# Patient Record
Sex: Male | Born: 1975 | Race: White | Hispanic: No | Marital: Single
Health system: Midwestern US, Community
[De-identification: ages and names within clinical notes are randomized; demographics above are authoritative.]

## PROBLEM LIST (undated history)

## (undated) DIAGNOSIS — E7800 Pure hypercholesterolemia, unspecified: Secondary | ICD-10-CM

## (undated) DIAGNOSIS — E039 Hypothyroidism, unspecified: Secondary | ICD-10-CM

## (undated) DIAGNOSIS — Z1159 Encounter for screening for other viral diseases: Secondary | ICD-10-CM

## (undated) DIAGNOSIS — I10 Essential (primary) hypertension: Secondary | ICD-10-CM

## (undated) DIAGNOSIS — M62830 Muscle spasm of back: Secondary | ICD-10-CM

## (undated) DIAGNOSIS — N201 Calculus of ureter: Secondary | ICD-10-CM

## (undated) DIAGNOSIS — J302 Other seasonal allergic rhinitis: Secondary | ICD-10-CM

## (undated) DIAGNOSIS — E781 Pure hyperglyceridemia: Secondary | ICD-10-CM

## (undated) DIAGNOSIS — F419 Anxiety disorder, unspecified: Secondary | ICD-10-CM

## (undated) DIAGNOSIS — Z87442 Personal history of urinary calculi: Secondary | ICD-10-CM

## (undated) HISTORY — DX: Anxiety disorder, unspecified: F41.9

## (undated) HISTORY — DX: Pure hyperglyceridemia: E78.1

---

## 2010-07-08 DIAGNOSIS — E039 Hypothyroidism, unspecified: Secondary | ICD-10-CM | POA: Insufficient documentation

## 2010-07-08 DIAGNOSIS — E559 Vitamin D deficiency, unspecified: Secondary | ICD-10-CM | POA: Insufficient documentation

## 2010-07-08 DIAGNOSIS — F419 Anxiety disorder, unspecified: Secondary | ICD-10-CM | POA: Insufficient documentation

## 2014-10-02 ENCOUNTER — Encounter (HOSPITAL_BASED_OUTPATIENT_CLINIC_OR_DEPARTMENT_OTHER): Payer: Self-pay | Admitting: *Deleted

## 2014-10-02 ENCOUNTER — Emergency Department (HOSPITAL_BASED_OUTPATIENT_CLINIC_OR_DEPARTMENT_OTHER)
Admission: EM | Admit: 2014-10-02 | Discharge: 2014-10-02 | Disposition: A | Discharge status: Home / Self Care | Payer: BLUE CROSS/BLUE SHIELD | Attending: Emergency Medicine | Admitting: Emergency Medicine

## 2014-10-02 ENCOUNTER — Emergency Department (HOSPITAL_BASED_OUTPATIENT_CLINIC_OR_DEPARTMENT_OTHER): Payer: BLUE CROSS/BLUE SHIELD

## 2014-10-02 DIAGNOSIS — R109 Unspecified abdominal pain: Secondary | ICD-10-CM | POA: Diagnosis present

## 2014-10-02 DIAGNOSIS — N200 Calculus of kidney: Secondary | ICD-10-CM | POA: Diagnosis not present

## 2014-10-02 DIAGNOSIS — Z720 Tobacco use: Secondary | ICD-10-CM | POA: Insufficient documentation

## 2014-10-02 DIAGNOSIS — Z79899 Other long term (current) drug therapy: Secondary | ICD-10-CM | POA: Diagnosis not present

## 2014-10-02 LAB — CBC
HCT: 45.3 % (ref 39.0–52.0)
Hemoglobin: 16 g/dL (ref 13.0–17.0)
MCH: 29.6 pg (ref 26.0–34.0)
MCHC: 35.3 g/dL (ref 30.0–36.0)
MCV: 83.9 fL (ref 78.0–100.0)
PLATELETS: 295 10*3/uL (ref 150–400)
RBC: 5.4 MIL/uL (ref 4.22–5.81)
RDW: 13.3 % (ref 11.5–15.5)
WBC: 8.3 10*3/uL (ref 4.0–10.5)

## 2014-10-02 LAB — URINALYSIS, ROUTINE W REFLEX MICROSCOPIC
BILIRUBIN URINE: NEGATIVE
Glucose, UA: NEGATIVE mg/dL
Ketones, ur: NEGATIVE mg/dL
Leukocytes, UA: NEGATIVE
Nitrite: NEGATIVE
Protein, ur: NEGATIVE mg/dL
SPECIFIC GRAVITY, URINE: 1.029 (ref 1.005–1.030)
UROBILINOGEN UA: 0.2 mg/dL (ref 0.0–1.0)
pH: 5.5 (ref 5.0–8.0)

## 2014-10-02 LAB — COMPREHENSIVE METABOLIC PANEL
ALT: 26 U/L (ref 17–63)
ANION GAP: 8 (ref 5–15)
AST: 28 U/L (ref 15–41)
Albumin: 4.4 g/dL (ref 3.5–5.0)
Alkaline Phosphatase: 53 U/L (ref 38–126)
BUN: 29 mg/dL — AB (ref 6–20)
CHLORIDE: 107 mmol/L (ref 101–111)
CO2: 23 mmol/L (ref 22–32)
Calcium: 9.4 mg/dL (ref 8.9–10.3)
Creatinine, Ser: 1.38 mg/dL — ABNORMAL HIGH (ref 0.61–1.24)
Glucose, Bld: 116 mg/dL — ABNORMAL HIGH (ref 65–99)
POTASSIUM: 4.2 mmol/L (ref 3.5–5.1)
Sodium: 138 mmol/L (ref 135–145)
Total Bilirubin: 0.7 mg/dL (ref 0.3–1.2)
Total Protein: 7.5 g/dL (ref 6.5–8.1)

## 2014-10-02 LAB — URINE MICROSCOPIC-ADD ON

## 2014-10-02 MED ORDER — ONDANSETRON HCL 4 MG/2ML IJ SOLN
4.0000 mg | Freq: Once | INTRAMUSCULAR | Status: AC
  Administered 2014-10-02: 4 mg via INTRAVENOUS

## 2014-10-02 MED ORDER — FENTANYL CITRATE (PF) 100 MCG/2ML IJ SOLN
INTRAMUSCULAR | Status: DC
Start: 2014-10-02 — End: 2014-10-02
  Filled 2014-10-02: qty 2

## 2014-10-02 MED ORDER — OXYCODONE-ACETAMINOPHEN 5-325 MG PO TABS: 1.0000 | ORAL_TABLET | Freq: Four times a day (QID) | ORAL | Status: DC | PRN

## 2014-10-02 MED ORDER — KETOROLAC TROMETHAMINE 30 MG/ML IJ SOLN
30.0000 mg | Freq: Once | INTRAMUSCULAR | Status: AC
  Administered 2014-10-02: 30 mg via INTRAVENOUS

## 2014-10-02 MED ORDER — FENTANYL CITRATE (PF) 100 MCG/2ML IJ SOLN
100.0000 ug | Freq: Once | INTRAMUSCULAR | Status: AC
  Administered 2014-10-02: 100 ug via INTRAVENOUS

## 2014-10-02 MED ORDER — KETOROLAC TROMETHAMINE 30 MG/ML IJ SOLN
INTRAMUSCULAR | Status: AC
  Filled 2014-10-02: qty 1

## 2014-10-02 MED ORDER — ONDANSETRON HCL 4 MG/2ML IJ SOLN
INTRAMUSCULAR | Status: AC
  Filled 2014-10-02: qty 2

## 2014-10-02 MED ORDER — TAMSULOSIN HCL 0.4 MG PO CAPS: 0.4000 mg | ORAL_CAPSULE | Freq: Every day | ORAL | Status: DC

## 2014-10-02 NOTE — ED Notes (Signed)
MD at bedside. 

## 2014-10-02 NOTE — ED Provider Notes (Signed)
CSN: [id number]     Arrival date & time 10/02/14  1256 History   First MD Initiated Contact with Patient 10/02/14 1322     Chief Complaint  Patient presents with  . Flank Pain     (Consider location/radiation/quality/duration/timing/severity/associated sxs/prior Treatment) HPI Comments: Patient is a 39 year old male with no significant past medical history. He presents for evaluation of severe right flank pain that started this morning slightly after waking from sleep. He denies any injury or trauma. He denies any fever, chills, or bowel or bladder complaints.  Patient is a 39 y.o. male presenting with flank pain. The history is provided by the patient.  Flank Pain This is a new problem. The current episode started 3 to 5 hours ago. The problem occurs constantly. The problem has been rapidly worsening. Associated symptoms include abdominal pain. Nothing aggravates the symptoms. Nothing relieves the symptoms. He has tried rest for the symptoms. The treatment provided no relief.    History reviewed. No pertinent past medical history. History reviewed. No pertinent past surgical history. History reviewed. No pertinent family history. Social History  Substance Use Topics  . Smoking status: Current Every Day Smoker -- 1.00 packs/day    Types: Cigarettes  . Smokeless tobacco: None  . Alcohol Use: No    Review of Systems  Gastrointestinal: Positive for abdominal pain.  Genitourinary: Positive for flank pain.  All other systems reviewed and are negative.     Allergies  Review of patient's allergies indicates no known allergies.  Home Medications   Prior to Admission medications   Medication Sig Start Date End Date Taking? Authorizing Provider  levothyroxine (SYNTHROID, LEVOTHROID) 100 MCG tablet Take 100 mcg by mouth daily before breakfast.   Yes Historical Provider, MD  LORazepam (ATIVAN) 0.5 MG tablet Take 0.5 mg by mouth every 8 (eight) hours.   Yes Historical Provider, MD    BP 114/92 mmHg  Pulse 82  Temp(Src) 98.9 F (37.2 C) (Oral)  Resp 18  Ht 5' 5"  (1.651 m)  Wt 185 lb (83.915 kg)  BMI 30.79 kg/m2  SpO2 98% Physical Exam  Constitutional: He is oriented to person, place, and time. He appears well-developed and well-nourished. No distress.  HENT:  Head: Normocephalic and atraumatic.  Neck: Normal range of motion. Neck supple.  Cardiovascular: Normal rate, regular rhythm and normal heart sounds.   No murmur heard. Pulmonary/Chest: Effort normal and breath sounds normal. No respiratory distress. He has no wheezes.  Abdominal: Soft. Bowel sounds are normal. He exhibits no distension. There is tenderness. There is no rebound and no guarding.  There is slight right CVA tenderness and right lower quadrant tenderness.  Musculoskeletal: Normal range of motion. He exhibits no edema.  Neurological: He is alert and oriented to person, place, and time.  Skin: Skin is warm and dry. He is not diaphoretic.  Nursing note and vitals reviewed.   ED Course  Procedures (including critical care time) Labs Review Labs Reviewed  URINALYSIS, ROUTINE W REFLEX MICROSCOPIC (NOT AT [facility])  CBC  COMPREHENSIVE METABOLIC PANEL    Imaging Review No results found. I have personally reviewed and evaluated these images and lab results as part of my medical decision-making.   EKG Interpretation None      MDM   Final diagnoses:  Flank pain    CT scan reveals a 4 mm stone in the mid ureter with moderate hydronephrosis. He is feeling better with medications given in the ER. There is no fever, white count, and urinalysis  does not reflect infection. Will be discharged with pain medication, Flomax, and when necessary follow-up with urology if the stone is not passed in the next 2-3 days.    Veryl Speak, MD 10/02/14 1501

## 2014-10-02 NOTE — ED Notes (Signed)
Pt amb to [facility] w/o difficulty

## 2014-10-02 NOTE — ED Notes (Signed)
Family at bedside. 

## 2014-10-02 NOTE — Discharge Instructions (Signed)
Percocet as prescribed as needed for pain.  Flomax as prescribed.  Call [facility] to schedule a follow-up appointment if your stone has not passed in the next 2-3 days. Return to the ER if pain worsens, you develop high fever, or other new and concerning symptoms.   Kidney Stones Kidney stones (urolithiasis) are deposits that form inside your kidneys. The intense pain is caused by the stone moving through the urinary tract. When the stone moves, the ureter goes into spasm around the stone. The stone is usually passed in the urine.  CAUSES   A disorder that makes certain neck glands produce too much parathyroid hormone (primary hyperparathyroidism).  A buildup of uric acid crystals, similar to gout in your joints.  Narrowing (stricture) of the ureter.  A kidney obstruction present at birth (congenital obstruction).  Previous surgery on the kidney or ureters.  Numerous kidney infections. SYMPTOMS   Feeling sick to your stomach (nauseous).  Throwing up (vomiting).  Blood in the urine (hematuria).  Pain that usually spreads (radiates) to the groin.  Frequency or urgency of urination. DIAGNOSIS   Taking a history and physical exam.  Blood or urine tests.  CT scan.  Occasionally, an examination of the inside of the urinary bladder (cystoscopy) is performed. TREATMENT   Observation.  Increasing your fluid intake.  Extracorporeal shock wave lithotripsy--This is a noninvasive procedure that uses shock waves to break up kidney stones.  Surgery may be needed if you have severe pain or persistent obstruction. There are various surgical procedures. Most of the procedures are performed with the use of small instruments. Only small incisions are needed to accommodate these instruments, so recovery time is minimized. The size, location, and chemical composition are all important variables that will determine the proper choice of action for you. Talk to your health care  provider to better understand your situation so that you will minimize the risk of injury to yourself and your kidney.  HOME CARE INSTRUCTIONS   Drink enough water and fluids to keep your urine clear or pale yellow. This will help you to pass the stone or stone fragments.  Strain all urine through the provided strainer. Keep all particulate matter and stones for your health care provider to see. The stone causing the pain may be as small as a grain of salt. It is very important to use the strainer each and every time you pass your urine. The collection of your stone will allow your health care provider to analyze it and verify that a stone has actually passed. The stone analysis will often identify what you can do to reduce the incidence of recurrences.  Only take over-the-counter or prescription medicines for pain, discomfort, or fever as directed by your health care provider.  Make a follow-up appointment with your health care provider as directed.  Get follow-up X-rays if required. The absence of pain does not always mean that the stone has passed. It may have only stopped moving. If the urine remains completely obstructed, it can cause loss of kidney function or even complete destruction of the kidney. It is your responsibility to make sure X-rays and follow-ups are completed. Ultrasounds of the kidney can show blockages and the status of the kidney. Ultrasounds are not associated with any radiation and can be performed easily in a matter of minutes. SEEK MEDICAL CARE IF:  You experience pain that is progressive and unresponsive to any pain medicine you have been prescribed. SEEK IMMEDIATE MEDICAL CARE IF:  Pain cannot be controlled with the prescribed medicine.  You have a fever or shaking chills.  The severity or intensity of pain increases over 18 hours and is not relieved by pain medicine.  You develop a new onset of abdominal pain.  You feel faint or pass out.  You are unable to  urinate. MAKE SURE YOU:   Understand these instructions.  Will watch your condition.  Will get help right away if you are not doing well or get worse. Document Released: 01/13/2005 Document Revised: 09/15/2012 Document Reviewed: 06/16/2012 [facility] Patient Information 2015 [place], [place]. This information is not intended to replace advice given to you by your health care provider. Make sure you discuss any questions you have with your health care provider.

## 2014-10-02 NOTE — ED Notes (Signed)
Right flank pain, N/V that started this morning.  Pt in obvious pain.

## 2015-04-03 DIAGNOSIS — S39012A Strain of muscle, fascia and tendon of lower back, initial encounter: Secondary | ICD-10-CM | POA: Insufficient documentation

## 2015-04-24 DIAGNOSIS — K580 Irritable bowel syndrome with diarrhea: Secondary | ICD-10-CM | POA: Insufficient documentation

## 2015-05-16 DIAGNOSIS — G4700 Insomnia, unspecified: Secondary | ICD-10-CM | POA: Insufficient documentation

## 2015-05-17 ENCOUNTER — Encounter (HOSPITAL_COMMUNITY): Payer: Self-pay | Admitting: Emergency Medicine

## 2015-05-17 ENCOUNTER — Emergency Department (HOSPITAL_COMMUNITY)
Admission: EM | Admit: 2015-05-17 | Discharge: 2015-05-17 | Disposition: A | Discharge status: Home / Self Care | Payer: Managed Care, Other (non HMO) | Attending: Emergency Medicine | Admitting: Emergency Medicine

## 2015-05-17 DIAGNOSIS — K644 Residual hemorrhoidal skin tags: Secondary | ICD-10-CM | POA: Diagnosis not present

## 2015-05-17 DIAGNOSIS — F17210 Nicotine dependence, cigarettes, uncomplicated: Secondary | ICD-10-CM | POA: Diagnosis not present

## 2015-05-17 DIAGNOSIS — K6289 Other specified diseases of anus and rectum: Secondary | ICD-10-CM | POA: Diagnosis present

## 2015-05-17 DIAGNOSIS — Z87442 Personal history of urinary calculi: Secondary | ICD-10-CM | POA: Insufficient documentation

## 2015-05-17 MED ORDER — HYDROCODONE-ACETAMINOPHEN 5-325 MG PO TABS: 1.0000 | ORAL_TABLET | ORAL | Status: DC | PRN

## 2015-05-17 MED ORDER — LIDOCAINE HCL 2 % IJ SOLN
5.0000 mL | Freq: Once | INTRAMUSCULAR | Status: AC
  Administered 2015-05-17: 5 mL via INTRADERMAL
  Filled 2015-05-17: qty 20

## 2015-05-17 MED ORDER — HYDROCORTISONE 2.5 % RE CREA: TOPICAL_CREAM | RECTAL | Status: DC

## 2015-05-17 NOTE — ED Notes (Signed)
Pt c/o of swollen hemorrhoid and 7/10 rectal pain getting progressively worse. Pt denies any bleeding.

## 2015-05-17 NOTE — ED Provider Notes (Signed)
CSN: [id number]     Arrival date & time 05/17/15  0357 History   First MD Initiated Contact with Patient 05/17/15 0505     Chief Complaint  Patient presents with  . Rectal Pain     (Consider location/radiation/quality/duration/timing/severity/associated sxs/prior Treatment) HPI Comments: Patient with complaint of rectal pain secondary to large hemorrhoid that started to swell and become painful yesterday. No bleeding. History of hemorrhoids usually responsive to sitz baths and OTC medications. He denies constipation.  The history is provided by the patient. No language interpreter was used.    Past Medical History  Diagnosis Date  . Kidney stones    History reviewed. No pertinent past surgical history. History reviewed. No pertinent family history. Social History  Substance Use Topics  . Smoking status: Current Every Day Smoker -- 1.00 packs/day    Types: Cigarettes  . Smokeless tobacco: None  . Alcohol Use: No    Review of Systems  Constitutional: Negative for fever.  Gastrointestinal: Positive for rectal pain. Negative for abdominal pain and blood in stool.  Genitourinary: Negative for scrotal swelling and testicular pain.      Allergies  Review of patient's allergies indicates no known allergies.  Home Medications   Prior to Admission medications   Medication Sig Start Date End Date Taking? Authorizing Provider  levothyroxine (SYNTHROID, LEVOTHROID) 100 MCG tablet Take 100 mcg by mouth daily before breakfast.    Historical Provider, MD  LORazepam (ATIVAN) 0.5 MG tablet Take 0.5 mg by mouth every 8 (eight) hours.    Historical Provider, MD  oxyCODONE-acetaminophen (PERCOCET) 5-325 MG per tablet Take 1-2 tablets by mouth every 6 (six) hours as needed. 10/02/14   Veryl Speak, MD  tamsulosin (FLOMAX) 0.4 MG CAPS capsule Take 1 capsule (0.4 mg total) by mouth daily. 10/02/14   Veryl Speak, MD   BP 162/107 mmHg  Pulse 88  Temp(Src) 98 F (36.7 C) (Oral)  Resp 17  Ht  5' 5"  (1.651 m)  Wt 87.091 kg  BMI 31.95 kg/m2  SpO2 100% Physical Exam  Constitutional: He is oriented to person, place, and time. He appears well-developed and well-nourished.  Neck: Normal range of motion.  Pulmonary/Chest: Effort normal.  Abdominal: There is no tenderness.  Genitourinary:  Large external hemorrhoid without bleed.   Musculoskeletal: Normal range of motion.  Neurological: He is alert and oriented to person, place, and time.  Skin: Skin is warm and dry.  Psychiatric: He has a normal mood and affect.    ED Course  Procedures (including critical care time) Labs Review Labs Reviewed - No data to display  Imaging Review No results found. I have personally reviewed and evaluated these images and lab results as part of my medical decision-making.   EKG Interpretation None     INCISION AND DRAINAGE Performed by: Charlann Lange A Consent: Verbal consent obtained. Risks and benefits: risks, benefits and alternatives were discussed Type: external hemorrhoid  Body area: external hemorrhoid  Anesthesia: local infiltration  Incision was made with a #11 scalpel.  Local anesthetic: lidocaine 2% w/o epinephrine  Anesthetic total: 1 ml  Complexity: complex Blunt dissection to break up loculations  Drainage: purulent  Drainage amount: large clot evacuated    Patient tolerance: Patient tolerated the procedure well with no immediate complications.    MDM   Final diagnoses:  None    1. External hemorrhoid  Hemorrhoid lanced as per above note. Patient feels significantly improved. Stable for discharge home.     Charlann Lange, PA-C  27/63/94 3200  Delora Fuel, MD 37/94/44 [id number]

## 2015-05-17 NOTE — Discharge Instructions (Signed)
USE ANUSOL AS DIRECTED. USE NORCO WITH CAUTION AND WITH A STOOL SOFTENER TO AVOID CONSTIPATION. FOLLOW UP WITH DR. Kieth Brightly (GENERAL SURGEON) IF HEMORRHOID RECURS.    Hemorrhoids Hemorrhoids are swollen veins around the rectum or anus. There are two types of hemorrhoids:   Internal hemorrhoids. These occur in the veins just inside the rectum. They may poke through to the outside and become irritated and painful.  External hemorrhoids. These occur in the veins outside the anus and can be felt as a painful swelling or hard lump near the anus. CAUSES  Pregnancy.   Obesity.   Constipation or diarrhea.   Straining to have a bowel movement.   Sitting for long periods on the toilet.  Heavy lifting or other activity that caused you to strain.  Anal intercourse. SYMPTOMS   Pain.   Anal itching or irritation.   Rectal bleeding.   Fecal leakage.   Anal swelling.   One or more lumps around the anus.  DIAGNOSIS  Your caregiver may be able to diagnose hemorrhoids by visual examination. Other examinations or tests that may be performed include:   Examination of the rectal area with a gloved hand (digital rectal exam).   Examination of anal canal using a small tube (scope).   A blood test if you have lost a significant amount of blood.  A test to look inside the colon (sigmoidoscopy or colonoscopy). TREATMENT Most hemorrhoids can be treated at home. However, if symptoms do not seem to be getting better or if you have a lot of rectal bleeding, your caregiver may perform a procedure to help make the hemorrhoids get smaller or remove them completely. Possible treatments include:   Placing a rubber band at the base of the hemorrhoid to cut off the circulation (rubber band ligation).   Injecting a chemical to shrink the hemorrhoid (sclerotherapy).   Using a tool to burn the hemorrhoid (infrared light therapy).   Surgically removing the hemorrhoid (hemorrhoidectomy).    Stapling the hemorrhoid to block blood flow to the tissue (hemorrhoid stapling).  HOME CARE INSTRUCTIONS   Eat foods with fiber, such as whole grains, beans, nuts, fruits, and vegetables. Ask your doctor about taking products with added fiber in them (fibersupplements).  Increase fluid intake. Drink enough water and fluids to keep your urine clear or pale yellow.   Exercise regularly.   Go to the bathroom when you have the urge to have a bowel movement. Do not wait.   Avoid straining to have bowel movements.   Keep the anal area dry and clean. Use wet toilet paper or moist towelettes after a bowel movement.   Medicated creams and suppositories may be used or applied as directed.   Only take over-the-counter or prescription medicines as directed by your caregiver.   Take warm sitz baths for 15-20 minutes, 3-4 times a day to ease pain and discomfort.   Place ice packs on the hemorrhoids if they are tender and swollen. Using ice packs between sitz baths may be helpful.   Put ice in a plastic bag.   Place a towel between your skin and the bag.   Leave the ice on for 15-20 minutes, 3-4 times a day.   Do not use a donut-shaped pillow or sit on the toilet for long periods. This increases blood pooling and pain.  SEEK MEDICAL CARE IF:  You have increasing pain and swelling that is not controlled by treatment or medicine.  You have uncontrolled bleeding.  You have  difficulty or you are unable to have a bowel movement.  You have pain or inflammation outside the area of the hemorrhoids. MAKE SURE YOU:  Understand these instructions.  Will watch your condition.  Will get help right away if you are not doing well or get worse.   This information is not intended to replace advice given to you by your health care provider. Make sure you discuss any questions you have with your health care provider.   Document Released: 01/11/2000 Document Revised: 12/31/2011  Document Reviewed: 11/18/2011 Elsevier Interactive Patient Education [employer].

## 2015-09-13 ENCOUNTER — Emergency Department (HOSPITAL_COMMUNITY)
Admission: EM | Admit: 2015-09-13 | Discharge: 2015-09-13 | Disposition: A | Discharge status: Home / Self Care | Payer: Managed Care, Other (non HMO) | Attending: Emergency Medicine | Admitting: Emergency Medicine

## 2015-09-13 ENCOUNTER — Emergency Department (HOSPITAL_COMMUNITY): Payer: Managed Care, Other (non HMO)

## 2015-09-13 ENCOUNTER — Encounter (HOSPITAL_COMMUNITY): Payer: Self-pay | Admitting: Emergency Medicine

## 2015-09-13 DIAGNOSIS — N200 Calculus of kidney: Secondary | ICD-10-CM | POA: Insufficient documentation

## 2015-09-13 DIAGNOSIS — R1032 Left lower quadrant pain: Secondary | ICD-10-CM | POA: Diagnosis present

## 2015-09-13 DIAGNOSIS — F17210 Nicotine dependence, cigarettes, uncomplicated: Secondary | ICD-10-CM | POA: Insufficient documentation

## 2015-09-13 DIAGNOSIS — Z79899 Other long term (current) drug therapy: Secondary | ICD-10-CM | POA: Diagnosis not present

## 2015-09-13 LAB — URINALYSIS, ROUTINE W REFLEX MICROSCOPIC
Bilirubin Urine: NEGATIVE
Glucose, UA: NEGATIVE mg/dL
KETONES UR: NEGATIVE mg/dL
Leukocytes, UA: NEGATIVE
NITRITE: NEGATIVE
Protein, ur: NEGATIVE mg/dL
SPECIFIC GRAVITY, URINE: 1.026 (ref 1.005–1.030)
pH: 6.5 (ref 5.0–8.0)

## 2015-09-13 LAB — COMPREHENSIVE METABOLIC PANEL
ALT: 23 U/L (ref 17–63)
AST: 26 U/L (ref 15–41)
Albumin: 4.4 g/dL (ref 3.5–5.0)
Alkaline Phosphatase: 58 U/L (ref 38–126)
Anion gap: 11 (ref 5–15)
BUN: 23 mg/dL — ABNORMAL HIGH (ref 6–20)
CALCIUM: 9.6 mg/dL (ref 8.9–10.3)
CHLORIDE: 104 mmol/L (ref 101–111)
CO2: 22 mmol/L (ref 22–32)
Creatinine, Ser: 1.12 mg/dL (ref 0.61–1.24)
GFR calc Af Amer: >60 mL/min (ref >60)
GFR calc non Af Amer: >60 mL/min (ref >60)
Glucose, Bld: 102 mg/dL — ABNORMAL HIGH (ref 65–99)
POTASSIUM: 3.7 mmol/L (ref 3.5–5.1)
SODIUM: 137 mmol/L (ref 135–145)
TOTAL PROTEIN: 6.9 g/dL (ref 6.5–8.1)
Total Bilirubin: 0.5 mg/dL (ref 0.3–1.2)

## 2015-09-13 LAB — CBC
HCT: 45.9 % (ref 39.0–52.0)
HEMOGLOBIN: 15.9 g/dL (ref 13.0–17.0)
MCH: 29.9 pg (ref 26.0–34.0)
MCHC: 34.6 g/dL (ref 30.0–36.0)
MCV: 86.3 fL (ref 78.0–100.0)
Platelets: 311 10*3/uL (ref 150–400)
RBC: 5.32 MIL/uL (ref 4.22–5.81)
RDW: 13.4 % (ref 11.5–15.5)
WBC: 12.3 10*3/uL — ABNORMAL HIGH (ref 4.0–10.5)

## 2015-09-13 LAB — URINE MICROSCOPIC-ADD ON: WBC UA: NONE SEEN WBC/hpf (ref 0–5)

## 2015-09-13 LAB — LIPASE, BLOOD: LIPASE: 42 U/L (ref 11–51)

## 2015-09-13 MED ORDER — TAMSULOSIN HCL 0.4 MG PO CAPS: 0.4000 mg | ORAL_CAPSULE | Freq: Every day | ORAL | 0 refills | Status: DC

## 2015-09-13 MED ORDER — OXYCODONE-ACETAMINOPHEN 5-325 MG PO TABS: 1.0000 | ORAL_TABLET | Freq: Four times a day (QID) | ORAL | 0 refills | Status: DC | PRN

## 2015-09-13 MED ORDER — ONDANSETRON HCL 4 MG/2ML IJ SOLN
4.0000 mg | Freq: Once | INTRAMUSCULAR | Status: AC
  Administered 2015-09-13: 4 mg via INTRAVENOUS
  Filled 2015-09-13: qty 2

## 2015-09-13 MED ORDER — HYDROMORPHONE HCL 1 MG/ML IJ SOLN: 0.5000 mg | Freq: Once | INTRAMUSCULAR | Status: DC

## 2015-09-13 MED ORDER — DOCUSATE SODIUM 100 MG PO CAPS: 100.0000 mg | ORAL_CAPSULE | Freq: Two times a day (BID) | ORAL | 0 refills | Status: DC

## 2015-09-13 MED ORDER — ONDANSETRON 4 MG PO TBDP: 4.0000 mg | ORAL_TABLET | Freq: Three times a day (TID) | ORAL | 0 refills | Status: DC | PRN

## 2015-09-13 MED ORDER — OXYCODONE-ACETAMINOPHEN 5-325 MG PO TABS
1.0000 | ORAL_TABLET | Freq: Once | ORAL | Status: AC
  Administered 2015-09-13: 1 via ORAL

## 2015-09-13 MED ORDER — KETOROLAC TROMETHAMINE 30 MG/ML IJ SOLN
30.0000 mg | Freq: Once | INTRAMUSCULAR | Status: AC
Start: 2015-09-13 — End: 2015-09-13
  Administered 2015-09-13: 30 mg via INTRAVENOUS
  Filled 2015-09-13: qty 1

## 2015-09-13 MED ORDER — SODIUM CHLORIDE 0.9 % IV BOLUS (SEPSIS)
1000.0000 mL | Freq: Once | INTRAVENOUS | Status: AC
  Administered 2015-09-13: 1000 mL via INTRAVENOUS

## 2015-09-13 MED ORDER — OXYCODONE-ACETAMINOPHEN 5-325 MG PO TABS
ORAL_TABLET | ORAL | Status: AC
  Filled 2015-09-13: qty 1

## 2015-09-13 MED ORDER — HYDROMORPHONE HCL 1 MG/ML IJ SOLN
1.0000 mg | Freq: Once | INTRAMUSCULAR | Status: AC
  Administered 2015-09-13: 1 mg via INTRAVENOUS
  Filled 2015-09-13: qty 1

## 2015-09-13 MED ORDER — IBUPROFEN 800 MG PO TABS: 800.0000 mg | ORAL_TABLET | Freq: Three times a day (TID) | ORAL | 0 refills | Status: DC | PRN

## 2015-09-13 NOTE — ED Provider Notes (Signed)
TIME SEEN: 6:00 AM  CHIEF COMPLAINT: Left-sided flank pain  HPI: Pt is a 40 y.o. male with history of kidney stents who presents to the emergency department with left-sided flank pain. States pain started yesterday and ascending onset. No aggravating or relieving factors. Reports he feels he can get comfortable. Has had vomiting. States this feels exactly like his prior kidney stones but worse. Radiates into his groin. Denies fevers, chills, diarrhea, abdominal pain, dysuria, testicular pain or swelling, penile discharge. Has never had surgery for his kidney stones.  ROS: See HPI Constitutional: no fever  Eyes: no drainage  ENT: no runny nose   Cardiovascular:  no chest pain  Resp: no SOB  GI: no vomiting GU: no dysuria Integumentary: no rash  Allergy: no hives  Musculoskeletal: no leg swelling  Neurological: no slurred speech ROS otherwise negative  PAST MEDICAL HISTORY/PAST SURGICAL HISTORY:  Past Medical History:  Diagnosis Date  . Kidney stones     MEDICATIONS:  Prior to Admission medications   Medication Sig Start Date End Date Taking? Authorizing Provider  HYDROcodone-acetaminophen (NORCO/VICODIN) 5-325 MG tablet Take 1-2 tablets by mouth every 4 (four) hours as needed. 05/17/15   Charlann Lange, PA-C  hydrocortisone (ANUSOL-HC) 2.5 % rectal cream Apply rectally 2 times daily 05/17/15   Charlann Lange, PA-C  levothyroxine (SYNTHROID, LEVOTHROID) 112 MCG tablet Take 112 mcg by mouth daily. 02/28/15   Historical Provider, MD  oxyCODONE-acetaminophen (PERCOCET) 5-325 MG per tablet Take 1-2 tablets by mouth every 6 (six) hours as needed. Patient not taking: Reported on 05/17/2015 10/02/14   Veryl Speak, MD  Suvorexant (BELSOMRA) 5 MG TABS Take 5-10 mg by mouth at bedtime. 05/16/15   Historical Provider, MD  tamsulosin (FLOMAX) 0.4 MG CAPS capsule Take 1 capsule (0.4 mg total) by mouth daily. Patient not taking: Reported on 05/17/2015 10/02/14   Veryl Speak, MD    ALLERGIES:  No Known  Allergies  SOCIAL HISTORY:  Social History  Substance Use Topics  . Smoking status: Current Every Day Smoker    Packs/day: 1.00    Types: Cigarettes  . Smokeless tobacco: Not on file  . Alcohol use No    FAMILY HISTORY: No family history on file.  EXAM: BP 95/56 (BP Location: Right Arm)   Pulse 90   Resp (!) 30   Ht 5' 5"  (1.651 m)   Wt 195 lb (88.5 kg)   SpO2 100%   BMI 32.45 kg/m  CONSTITUTIONAL: Alert and oriented and responds appropriately to questions. He appears very uncomfortable, moaning and crying out in pain, writhing around in bed, grabbing at his left flank. Patient is afebrile, nontoxic appearing. HEAD: Normocephalic EYES: Conjunctivae clear, PERRL ENT: normal nose; no rhinorrhea; moist mucous membranes NECK: Supple, no meningismus, no LAD  CARD: RRR; S1 and S2 appreciated; no murmurs, no clicks, no rubs, no gallops RESP: Normal chest excursion without splinting or tachypnea; breath sounds clear and equal bilaterally; no wheezes, no rhonchi, no rales, no hypoxia or respiratory distress, speaking full sentences ABD/GI: Normal bowel sounds; non-distended; soft, non-tender, no rebound, no guarding, no peritoneal signs BACK:  The back appears normal and is non-tender to palpation over the midline spine without step-off or deformity, he does have left-sided CVA tenderness on exam EXT: Normal ROM in all joints; non-tender to palpation; no edema; normal capillary refill; no cyanosis, no calf tenderness or swelling    SKIN: Normal color for age and race; warm; no rash NEURO: Moves all extremities equally, sensation to light touch  intact diffusely, cranial nerves II through XII intact PSYCH: The patient's mood and manner are appropriate. Grooming and personal hygiene are appropriate.  MEDICAL DECISION MAKING: Patient here with left-sided flank pain similar to his prior kidney stones. Appears very uncomfortable on exam. He is not hypertensive. I doubt that this is a  dissection. I think this is more likely renal colic. Will obtain a CT of his abdomen and pelvis. Labs show mild leukocytosis. Urine shows blood but no other sign of infection. We'll give pain medicine, nausea medicine, IV fluids.  ED PROGRESS: Patient's pain is improved.  Urine shows no sign of infection. CT scan shows a 1 mm distal left ureteral stone. We'll give him a urine strainer, discharge with pain medication, Flomax, Zofran. Given outpatient urology follow-up. Patient and wife at bedside are comfortable with this plan.   At this time, I do not feel there is any life-threatening condition present. I have reviewed and discussed all results (EKG, imaging, lab, urine as appropriate), exam findings with patient/family. I have reviewed nursing notes and appropriate previous records.  I feel the patient is safe to be discharged home without further emergent workup and can continue workup as an outpatient. Discussed usual and customary return precautions. Patient/family verbalize understanding and are comfortable with this plan.  Outpatient follow-up has been provided. All questions have been answered.      [place], DO 09/18/15 [phone]

## 2015-09-13 NOTE — ED Triage Notes (Signed)
Pt. reports LLQ pain radiating to left groin onset this evening with emesis , pt. stated pain similar to his kidney stone in the past . Denies fever or chills.

## 2015-10-17 DIAGNOSIS — E781 Pure hyperglyceridemia: Secondary | ICD-10-CM | POA: Insufficient documentation

## 2015-11-22 ENCOUNTER — Ambulatory Visit (INDEPENDENT_AMBULATORY_CARE_PROVIDER_SITE_OTHER): Payer: Managed Care, Other (non HMO) | Admitting: Allergy

## 2015-11-22 ENCOUNTER — Encounter: Payer: Self-pay | Admitting: Allergy

## 2015-11-22 VITALS — BP 110/72 | HR 96 | Temp 98.2°F | Ht 65.0 in | Wt 190.6 lb

## 2015-11-22 DIAGNOSIS — J309 Allergic rhinitis, unspecified: Secondary | ICD-10-CM

## 2015-11-22 DIAGNOSIS — H1010 Acute atopic conjunctivitis, unspecified eye: Secondary | ICD-10-CM | POA: Insufficient documentation

## 2015-11-22 MED ORDER — AZELASTINE HCL 0.1 % NA SOLN: 2.0000 | Freq: Two times a day (BID) | NASAL | 5 refills | Status: DC

## 2015-11-22 NOTE — Addendum Note (Signed)
Addended by: Gara Kroner L on: 11/22/2015 04:18 PM   Modules accepted: Orders

## 2015-11-22 NOTE — Addendum Note (Signed)
Addended by: Gara Kroner L on: 11/22/2015 04:21 PM   Modules accepted: Orders

## 2015-11-22 NOTE — Patient Instructions (Addendum)
Allergy testing today was positive for grasses, weeds, trees, cat  Continue Zyrtec 10 mg as needed or may try Xyzal 5 mg daily  Continue nasal steroid spray like Flonase or Rhinocort 2 sprays each nostril daily  Use Astelin 2 sprays each nostril twice a day  Recommend use of nasal saline rinse prior to nasal spray use  Demonstrated proper nasal spray technique  Food allergy testing was negative  Follow-up in 6 months

## 2015-11-22 NOTE — Progress Notes (Signed)
New Patient Note  RE: Luis Graham MRN: [id number] DOB: December 31, 1975 Date of Office Visit: 11/22/2015  Referring provider: Velna Ochs, MD Primary care provider: Velna Ochs, MD  Chief Complaint: Allergies  History of present illness: Luis Graham is a 40 y.o. male presenting today for consultation for allergic rhinitis.  Over the past 79yr he has been having sneezing, nasal congestion, watery eyes, headache. Symptoms can be all year round but he states are very inconsistent.  He reports he can have issues with cutting the grass on same days while other days he is asymptomatic.   He has tried Claritin, Zyrtec, flonase, Rhinocort.  He reports sometimes the allergy medications help and sometimes not.  Zyrtec seems to help him the best.   He is also curious if he has any food allergy.  He does not know any particular foods that causes him to have problems.  He does repot sometimes with milk ingestion he "feels weird".    No history of asthma, eczema or drug allergy.     Review of systems: Review of Systems  Constitutional: Negative for chills and fever.  HENT: Positive for congestion. Negative for sore throat.   Eyes: Positive for redness.  Respiratory: Negative for cough, shortness of breath and wheezing.   Cardiovascular: Negative for chest pain.  Gastrointestinal: Negative for nausea and vomiting.  Skin: Negative for itching and rash.  Neurological: Positive for headaches.    All other systems negative unless noted above in HPI  Past medical history: Past Medical History:  Diagnosis Date  . Anxiety   . Hypertriglyceridemia   . IBS (irritable bowel syndrome)   . Insomnia   . Kidney stones   . Vitamin D deficiency    Past surgical history: History reviewed. No pertinent surgical history.  Family history:  Family History  Problem Relation Age of Onset  . Allergic rhinitis Sister   . Angioedema Neg Hx   . Asthma Neg Hx   . Atopy Neg Hx   . Eczema  Neg Hx   . Immunodeficiency Neg Hx   . Urticaria Neg Hx     Social history: He lives in a home with carpeting in the bedroom with electric heating and central cooling. He denies any concern for water damage or mildew or cockroaches in the home. He has a dog in the home. He works as a [employer] operational officer  He is a smoker 1 pack per day for the last 25 years  Medication List:   Medication List       Accurate as of 11/22/15  4:01 PM. Always use your most recent med list.          cyclobenzaprine 5 MG tablet Commonly known as:  FLEXERIL Take 5 mg by mouth 3 (three) times daily as needed for muscle spasms.   ibuprofen 800 MG tablet Commonly known as:  ADVIL,MOTRIN Take 1 tablet (800 mg total) by mouth every 8 (eight) hours as needed for mild pain.   levothyroxine 112 MCG tablet Commonly known as:  SYNTHROID, LEVOTHROID Take 112 mcg by mouth daily.   tamsulosin 0.4 MG Caps capsule Commonly known as:  FLOMAX Take 1 capsule (0.4 mg total) by mouth daily.       Known medication allergies: No Known Allergies   Physical examination: Blood pressure 110/72, pulse 96, temperature 98.2 F (36.8 C), temperature source Oral, height 5' 5"  (1.651 m), weight 190 lb 9.6 oz (86.5 kg), SpO2 98 %.  General: Alert, interactive,  in no acute distress. HEENT: TMs pearly gray, turbinates markedly edematous with clear discharge, post-pharynx non erythematous. Neck: Supple without lymphadenopathy. Lungs: Clear to auscultation without wheezing, rhonchi or rales. {no increased work of breathing. CV: Normal S1, S2 without murmurs. Abdomen: Nondistended, nontender. Skin: Warm and dry, without lesions or rashes. Extremities:  No clubbing, cyanosis or edema. Neuro:   Grossly intact.  Diagnositics/Labs:  Allergy testing: skin prick testing positive grasses, weeds, trees, cat.  IDs were negative for mold, dust mites, dog, cockroach.  Food allergy testing for most common allergens were  negative Allergy testing results were read and interpreted by provider, documented by clinical staff.   Assessment and plan:   Allergic rhinoconjunctivitis  - Allergy testing today was positive for grasses, weeds, trees, cat  - Continue Zyrtec 10 mg as needed or may try Xyzal 5 mg daily  - Continue nasal steroid spray like Flonase or Rhinocort 2 sprays each nostril daily  - Use Astelin 2 sprays each nostril twice a day  - Recommend use of nasal saline rinse prior to nasal spray use  - Demonstrated proper nasal spray technique  - discussed option of allergen immunotherapy if he continues to be poorly controlled with medication management  - allergen avoidance measures discussed  Food allergy testing was negative  Follow-up in 6 months   I appreciate the opportunity to take part in Jakin's care. Please do not hesitate to contact me with questions.  Sincerely,   Prudy Feeler, MD Allergy/Immunology [facility]

## 2015-12-25 NOTE — Addendum Note (Signed)
Addended by: Gara Kroner L on: 12/25/2015 08:55 AM   Modules accepted: Orders

## 2016-11-06 ENCOUNTER — Other Ambulatory Visit: Payer: Self-pay | Admitting: Allergy

## 2016-11-06 DIAGNOSIS — H1010 Acute atopic conjunctivitis, unspecified eye: Secondary | ICD-10-CM

## 2016-11-06 DIAGNOSIS — J309 Allergic rhinitis, unspecified: Principal | ICD-10-CM

## 2017-07-24 ENCOUNTER — Other Ambulatory Visit: Payer: Self-pay | Admitting: Urology

## 2017-07-31 ENCOUNTER — Encounter (HOSPITAL_BASED_OUTPATIENT_CLINIC_OR_DEPARTMENT_OTHER): Payer: Self-pay | Admitting: *Deleted

## 2017-07-31 ENCOUNTER — Other Ambulatory Visit: Payer: Self-pay

## 2017-07-31 NOTE — Progress Notes (Signed)
Spoke w/ pt via phone for pre-op interview.  Npo after mn w/ exception clear liquids until 0630 (no cream /milk products).  Arrive at 1030.

## 2017-08-05 ENCOUNTER — Ambulatory Visit (HOSPITAL_BASED_OUTPATIENT_CLINIC_OR_DEPARTMENT_OTHER): Payer: BLUE CROSS/BLUE SHIELD | Admitting: Anesthesiology

## 2017-08-05 ENCOUNTER — Encounter (HOSPITAL_BASED_OUTPATIENT_CLINIC_OR_DEPARTMENT_OTHER)
Admission: RE | Disposition: A | Discharge status: Home / Self Care | Payer: Self-pay | Source: Ambulatory Visit | Attending: Urology

## 2017-08-05 ENCOUNTER — Other Ambulatory Visit: Payer: Self-pay

## 2017-08-05 ENCOUNTER — Encounter (HOSPITAL_BASED_OUTPATIENT_CLINIC_OR_DEPARTMENT_OTHER): Payer: Self-pay | Admitting: *Deleted

## 2017-08-05 ENCOUNTER — Ambulatory Visit (HOSPITAL_BASED_OUTPATIENT_CLINIC_OR_DEPARTMENT_OTHER)
Admission: RE | Admit: 2017-08-05 | Discharge: 2017-08-05 | Disposition: A | Discharge status: Home / Self Care | Payer: BLUE CROSS/BLUE SHIELD | Source: Ambulatory Visit | Attending: Urology | Admitting: Urology

## 2017-08-05 DIAGNOSIS — Z87442 Personal history of urinary calculi: Secondary | ICD-10-CM | POA: Insufficient documentation

## 2017-08-05 DIAGNOSIS — Z6832 Body mass index (BMI) 32.0-32.9, adult: Secondary | ICD-10-CM | POA: Insufficient documentation

## 2017-08-05 DIAGNOSIS — N132 Hydronephrosis with renal and ureteral calculous obstruction: Secondary | ICD-10-CM | POA: Diagnosis not present

## 2017-08-05 DIAGNOSIS — F17210 Nicotine dependence, cigarettes, uncomplicated: Secondary | ICD-10-CM | POA: Insufficient documentation

## 2017-08-05 DIAGNOSIS — E669 Obesity, unspecified: Secondary | ICD-10-CM | POA: Insufficient documentation

## 2017-08-05 DIAGNOSIS — E039 Hypothyroidism, unspecified: Secondary | ICD-10-CM | POA: Diagnosis not present

## 2017-08-05 HISTORY — DX: Personal history of urinary calculi: Z87.442

## 2017-08-05 HISTORY — DX: Other seasonal allergic rhinitis: J30.2

## 2017-08-05 HISTORY — DX: Calculus of ureter: N20.1

## 2017-08-05 HISTORY — DX: Hypothyroidism, unspecified: E03.9

## 2017-08-05 HISTORY — PX: CYSTOSCOPY/URETEROSCOPY/HOLMIUM LASER/STENT PLACEMENT: SHX6546

## 2017-08-05 SURGERY — CYSTOSCOPY/URETEROSCOPY/HOLMIUM LASER/STENT PLACEMENT
Anesthesia: General | Site: Ureter | Laterality: Left

## 2017-08-05 MED ORDER — OXYCODONE HCL 5 MG PO TABS
5.0000 mg | ORAL_TABLET | Freq: Once | ORAL | Status: DC | PRN
  Filled 2017-08-05: qty 1

## 2017-08-05 MED ORDER — PROPOFOL 10 MG/ML IV BOLUS
INTRAVENOUS | Status: DC | PRN
  Administered 2017-08-05: 50 mg via INTRAVENOUS
  Administered 2017-08-05: 200 mg via INTRAVENOUS

## 2017-08-05 MED ORDER — SULFAMETHOXAZOLE-TRIMETHOPRIM 800-160 MG PO TABS: 1.0000 | ORAL_TABLET | Freq: Two times a day (BID) | ORAL | 0 refills | Status: AC

## 2017-08-05 MED ORDER — PHENAZOPYRIDINE HCL 100 MG PO TABS
ORAL_TABLET | ORAL | Status: AC
  Filled 2017-08-05: qty 1

## 2017-08-05 MED ORDER — CEFAZOLIN SODIUM-DEXTROSE 2-4 GM/100ML-% IV SOLN
2.0000 g | Freq: Once | INTRAVENOUS | Status: AC
  Administered 2017-08-05: 2 g via INTRAVENOUS
  Filled 2017-08-05: qty 100

## 2017-08-05 MED ORDER — MEPERIDINE HCL 25 MG/ML IJ SOLN
6.2500 mg | INTRAMUSCULAR | Status: DC | PRN
  Filled 2017-08-05: qty 1

## 2017-08-05 MED ORDER — ONDANSETRON HCL 4 MG/2ML IJ SOLN
INTRAMUSCULAR | Status: AC
  Filled 2017-08-05: qty 2

## 2017-08-05 MED ORDER — KETOROLAC TROMETHAMINE 30 MG/ML IJ SOLN
INTRAMUSCULAR | Status: AC
  Filled 2017-08-05: qty 1

## 2017-08-05 MED ORDER — LIDOCAINE 2% (20 MG/ML) 5 ML SYRINGE
INTRAMUSCULAR | Status: DC | PRN
  Administered 2017-08-05: 100 mg via INTRAVENOUS

## 2017-08-05 MED ORDER — CEFAZOLIN SODIUM-DEXTROSE 2-4 GM/100ML-% IV SOLN
INTRAVENOUS | Status: AC
  Filled 2017-08-05: qty 100

## 2017-08-05 MED ORDER — ACETAMINOPHEN 325 MG PO TABS
325.0000 mg | ORAL_TABLET | ORAL | Status: DC | PRN
  Filled 2017-08-05: qty 2

## 2017-08-05 MED ORDER — ONDANSETRON HCL 4 MG/2ML IJ SOLN
4.0000 mg | Freq: Once | INTRAMUSCULAR | Status: DC | PRN
  Filled 2017-08-05: qty 2

## 2017-08-05 MED ORDER — FENTANYL CITRATE (PF) 100 MCG/2ML IJ SOLN
INTRAMUSCULAR | Status: DC | PRN
  Administered 2017-08-05 (×2): 50 ug via INTRAVENOUS

## 2017-08-05 MED ORDER — FENTANYL CITRATE (PF) 100 MCG/2ML IJ SOLN
INTRAMUSCULAR | Status: AC
  Filled 2017-08-05: qty 2

## 2017-08-05 MED ORDER — PROPOFOL 10 MG/ML IV BOLUS
INTRAVENOUS | Status: AC
  Filled 2017-08-05: qty 20

## 2017-08-05 MED ORDER — HYDROCODONE-ACETAMINOPHEN 5-325 MG PO TABS: 1.0000 | ORAL_TABLET | ORAL | 0 refills | Status: AC | PRN

## 2017-08-05 MED ORDER — LIDOCAINE 2% (20 MG/ML) 5 ML SYRINGE
INTRAMUSCULAR | Status: AC
  Filled 2017-08-05: qty 5

## 2017-08-05 MED ORDER — PHENAZOPYRIDINE HCL 200 MG PO TABS: 200.0000 mg | ORAL_TABLET | Freq: Three times a day (TID) | ORAL | 0 refills | Status: AC | PRN

## 2017-08-05 MED ORDER — SODIUM CHLORIDE 0.9 % IR SOLN
Status: DC | PRN
  Administered 2017-08-05: 3000 mL

## 2017-08-05 MED ORDER — ACETAMINOPHEN 10 MG/ML IV SOLN
1000.0000 mg | Freq: Once | INTRAVENOUS | Status: DC | PRN
  Filled 2017-08-05: qty 100

## 2017-08-05 MED ORDER — KETOROLAC TROMETHAMINE 30 MG/ML IJ SOLN
INTRAMUSCULAR | Status: DC | PRN
  Administered 2017-08-05: 30 mg via INTRAVENOUS

## 2017-08-05 MED ORDER — PHENAZOPYRIDINE HCL 200 MG PO TABS
200.0000 mg | ORAL_TABLET | Freq: Once | ORAL | Status: AC
  Administered 2017-08-05: 200 mg via ORAL
  Filled 2017-08-05: qty 1

## 2017-08-05 MED ORDER — MIDAZOLAM HCL 2 MG/2ML IJ SOLN
INTRAMUSCULAR | Status: AC
  Filled 2017-08-05: qty 2

## 2017-08-05 MED ORDER — OXYCODONE HCL 5 MG/5ML PO SOLN
5.0000 mg | Freq: Once | ORAL | Status: DC | PRN
  Filled 2017-08-05: qty 5

## 2017-08-05 MED ORDER — LACTATED RINGERS IV SOLN
INTRAVENOUS | Status: DC
  Administered 2017-08-05: 11:00:00 via INTRAVENOUS
  Filled 2017-08-05: qty 1000

## 2017-08-05 MED ORDER — ACETAMINOPHEN 160 MG/5ML PO SOLN
325.0000 mg | ORAL | Status: DC | PRN
  Filled 2017-08-05: qty 20.3

## 2017-08-05 MED ORDER — MIDAZOLAM HCL 5 MG/5ML IJ SOLN
INTRAMUSCULAR | Status: DC | PRN
  Administered 2017-08-05: 2 mg via INTRAVENOUS

## 2017-08-05 MED ORDER — DEXAMETHASONE SODIUM PHOSPHATE 10 MG/ML IJ SOLN
INTRAMUSCULAR | Status: DC | PRN
  Administered 2017-08-05: 10 mg via INTRAVENOUS

## 2017-08-05 MED ORDER — DEXAMETHASONE SODIUM PHOSPHATE 10 MG/ML IJ SOLN
INTRAMUSCULAR | Status: AC
Start: 2017-08-05 — End: ?
  Filled 2017-08-05: qty 1

## 2017-08-05 MED ORDER — ONDANSETRON HCL 4 MG/2ML IJ SOLN
INTRAMUSCULAR | Status: DC | PRN
  Administered 2017-08-05: 4 mg via INTRAVENOUS

## 2017-08-05 MED ORDER — ONDANSETRON HCL 4 MG PO TABS: 4.0000 mg | ORAL_TABLET | Freq: Every day | ORAL | 1 refills | Status: AC | PRN

## 2017-08-05 MED ORDER — FENTANYL CITRATE (PF) 100 MCG/2ML IJ SOLN
25.0000 ug | INTRAMUSCULAR | Status: DC | PRN
  Filled 2017-08-05: qty 1

## 2017-08-05 MED FILL — SULFAMETHOXAZOLE-TMP DS TAB: 800-160 | 3 days supply | Qty: 6 | Fill #0

## 2017-08-05 MED FILL — HYDROCODON-APAP 5-325: 5-325 | 3 days supply | Qty: 20 | Fill #0

## 2017-08-05 SURGICAL SUPPLY — 27 items
BAG DRAIN URO-CYSTO SKYTR STRL (DRAIN) ×3 IMPLANT
BASKET STONE 1.7 NGAGE (UROLOGICAL SUPPLIES) IMPLANT
BASKET ZERO TIP NITINOL 2.4FR (BASKET) ×3 IMPLANT
BENZOIN TINCTURE PRP APPL 2/3 (GAUZE/BANDAGES/DRESSINGS) ×3 IMPLANT
CATH URET 5FR 28IN OPEN ENDED (CATHETERS) ×3 IMPLANT
CLOSURE WOUND 1/2 X4 (GAUZE/BANDAGES/DRESSINGS) ×1
CLOTH BEACON ORANGE TIMEOUT ST (SAFETY) ×3 IMPLANT
FIBER LASER FLEXIVA 365 (UROLOGICAL SUPPLIES) ×3 IMPLANT
GLOVE BIO SURGEON STRL SZ7.5 (GLOVE) ×3 IMPLANT
GLOVE BIOGEL PI IND STRL 7.5 (GLOVE) ×2 IMPLANT
GLOVE BIOGEL PI INDICATOR 7.5 (GLOVE) ×4
GOWN STRL REUS W/TWL LRG LVL3 (GOWN DISPOSABLE) ×3 IMPLANT
GOWN STRL REUS W/TWL XL LVL3 (GOWN DISPOSABLE) ×3 IMPLANT
GUIDEWIRE STR DUAL SENSOR (WIRE) IMPLANT
GUIDEWIRE ZIPWRE .038 STRAIGHT (WIRE) ×3 IMPLANT
INFUSOR MANOMETER BAG 3000ML (MISCELLANEOUS) ×3 IMPLANT
IV NS IRRIG 3000ML ARTHROMATIC (IV SOLUTION) ×3 IMPLANT
KIT TURNOVER CYSTO (KITS) ×3 IMPLANT
MANIFOLD NEPTUNE II (INSTRUMENTS) ×3 IMPLANT
NS IRRIG 500ML POUR BTL (IV SOLUTION) ×3 IMPLANT
PACK CYSTO (CUSTOM PROCEDURE TRAY) ×3 IMPLANT
STENT URET 6FRX24 CONTOUR (STENTS) ×3 IMPLANT
STRIP CLOSURE SKIN 1/2X4 (GAUZE/BANDAGES/DRESSINGS) ×2 IMPLANT
SYR 10ML LL (SYRINGE) ×3 IMPLANT
TUBE CONNECTING 12'X1/4 (SUCTIONS) ×1
TUBE CONNECTING 12X1/4 (SUCTIONS) ×2 IMPLANT
TUBING UROLOGY SET (TUBING) ×3 IMPLANT

## 2017-08-05 NOTE — Anesthesia Procedure Notes (Signed)
Procedure Name: LMA Insertion Date/Time: 08/05/2017 12:55 PM Performed by: Bonney Aid, CRNA Pre-anesthesia Checklist: Patient identified, Emergency Drugs available, Suction available and Patient being monitored Patient Re-evaluated:Patient Re-evaluated prior to induction Oxygen Delivery Method: Circle system utilized Preoxygenation: Pre-oxygenation with 100% oxygen Induction Type: IV induction Ventilation: Mask ventilation without difficulty LMA: LMA inserted LMA Size: 4.0 Number of attempts: 1 Airway Equipment and Method: Bite block Placement Confirmation: positive ETCO2 Tube secured with: Tape Dental Injury: Teeth and Oropharynx as per pre-operative assessment

## 2017-08-05 NOTE — Discharge Instructions (Signed)
°  Post Anesthesia Home Care Instructions  Activity: Get plenty of rest for the remainder of the day. A responsible individual must stay with you for 24 hours following the procedure.  For the next 24 hours, DO NOT: -Drive a car -Paediatric nurse -Drink alcoholic beverages -Take any medication unless instructed by your physician -Make any legal decisions or sign important papers.  Meals: Start with liquid foods such as gelatin or soup. Progress to regular foods as tolerated. Avoid greasy, spicy, heavy foods. If nausea and/or vomiting occur, drink only clear liquids until the nausea and/or vomiting subsides. Call your physician if vomiting continues.  Special Instructions/Symptoms: Your throat may feel dry or sore from the anesthesia or the breathing tube placed in your throat during surgery. If this causes discomfort, gargle with warm salt water. The discomfort should disappear within 24 hours.  Remove stent on Monday July 15.

## 2017-08-05 NOTE — Op Note (Signed)
Operative Note  Preoperative diagnosis:  1.  4 mm left distal ureteral stone  Postoperative diagnosis: 1.  4 mm left distal ureteral stone  Procedure(s): 1.  Cystoscopy 2.  Left retrograde pyelogram with intraoperative interpretation of fluoroscopic imaging 3.  Left ureteroscopy 4.  Left holmium laser lithotripsy 5.  Left JJ stent placement with tether  Surgeon: Ellison Hughs, MD  Assistants: None  Anesthesia: General  Complications: None  EBL: Less than 5 mL  Specimens: 1.  4 mm left distal ureteral stone  Drains/Catheters: 1.  6 French by 24 cm left JJ stent with tether  Intraoperative findings:   1. Solitary left collecting system with no filling defects or dilation involving the left ureter or left renal pelvis seen on retrograde pyelogram 2. 4 mm left distal ureteral stone  Indication:  Luis Graham is a 42 y.o. male with ongoing left-sided flank pain after being diagnosed with a 4 mm left ureteral stone diagnosed on CT stone study from 06/08/2017.  He was given a trial of passage, but was still having persistent urinary urgency/frequency as well as intermittent episodes of left-sided flank pain.  He had a KUB on 07/16/2017 that showed a possible calcification in the proximity of the left distal ureter.  He wanted to give medical expulsive therapy another couple of weeks, but he states that his symptoms have persisted.  He has been consented for the above procedures, voices understanding and wishes to proceed.  Description of procedure:  After informed consent was obtained, the patient was brought to the operating room and general LMA anesthesia was administered. The patient was then placed in the dorsolithotomy position and prepped and draped in usual sterile fashion. A timeout was performed. A 23 French rigid cystoscope was then inserted into the urethral meatus and advanced into the bladder under direct vision. A complete bladder survey revealed no intravesical  pathology.  A 5 French open-ended catheter was then inserted into the left ureteral orifice and a left retrograde pyelogram was obtained, with the findings listed above.  A Glidewire was then advanced up the ureteral catheter and up to the left renal pelvis, under fluoroscopic guidance.  The rigid cystoscope was then exchanged for a semirigid ureteroscope, which was advanced alongside the wire and into the distal aspects of his left ureter, where a 4 mm stone was identified.  A 365 m holmium laser was then used to fracture the stone into several smaller pieces.  A nitinol tipless basket was then used to extract all stone fragments from the lumen of the left ureter.  The semirigid ureteroscope was then removed and exchanged for the rigid cystoscope, which was advanced over the wire.  A 6 French by 24 cm JJ stent with the tether intact was advanced over the wire and into good position within the left collecting system, confirming placement via fluoroscopy.  The patient's bladder was then drained and all stone fragments were removed.  The tether of the stent was then secured to the dorsal aspect of the penis with Mastisol and Steri-Strips.  He tolerated the procedure well and was transferred to the postanesthesia in stable condition.  Plan: The patient has been instructed to remove his JJ stent at 6 AM on 08/10/2017.

## 2017-08-05 NOTE — Transfer of Care (Signed)
Immediate Anesthesia Transfer of Care Note  Patient: Luis Graham  Procedure(s) Performed: CYSTOSCOPY/URETEROSCOPY/HOLMIUM LASER/STENT PLACEMENT (Left Ureter)  Patient Location: PACU  Anesthesia Type:General  Level of Consciousness: awake, alert  and oriented  Airway & Oxygen Therapy: Patient Spontanous Breathing and Patient connected to nasal cannula oxygen  Post-op Assessment: Report given to RN  Post vital signs: Reviewed and stable  Last Vitals: 107/71, 90, 16, 98% Vitals Value Taken Time  BP    Temp    Pulse    Resp    SpO2      Last Pain:  Vitals:   08/05/17 1032  TempSrc: Oral      Patients Stated Pain Goal: 4 (12/87/86 7672)  Complications: No apparent anesthesia complications

## 2017-08-05 NOTE — Anesthesia Preprocedure Evaluation (Signed)
Anesthesia Evaluation  Patient identified by MRN, date of birth, ID band Patient awake    Reviewed: Allergy & Precautions, NPO status , Patient's Chart, lab work & pertinent test results  Airway Mallampati: I       Dental no notable dental hx. (+) Teeth Intact   Pulmonary Current Smoker,    Pulmonary exam normal breath sounds clear to auscultation       Cardiovascular negative cardio ROS Normal cardiovascular exam Rhythm:Regular Rate:Normal     Neuro/Psych    GI/Hepatic   Endo/Other  Hypothyroidism   Renal/GU      Musculoskeletal negative musculoskeletal ROS (+)   Abdominal (+) + obese,   Peds  Hematology   Anesthesia Other Findings   Reproductive/Obstetrics                             Anesthesia Physical Anesthesia Plan  ASA: II  Anesthesia Plan: General   Post-op Pain Management:    Induction:   PONV Risk Score and Plan: 3 and Ondansetron, Dexamethasone and Midazolam  Airway Management Planned: LMA  Additional Equipment:   Intra-op Plan:   Post-operative Plan: Extubation in OR  Informed Consent: I have reviewed the patients History and Physical, chart, labs and discussed the procedure including the risks, benefits and alternatives for the proposed anesthesia with the patient or authorized representative who has indicated his/her understanding and acceptance.   Dental advisory given  Plan Discussed with: CRNA and Surgeon  Anesthesia Plan Comments:         Anesthesia Quick Evaluation

## 2017-08-05 NOTE — H&P (Signed)
Urology Preoperative H&P   Chief Complaint: Left flank pain and urinary urgency  History of Present Illness: Luis Graham is a 42 y.o. male with a history of kidney stones and was recently diagnosed with a 3.6 mm left midureteral stone associated with left hydronephrosis seen on CTSS on 06/08/2017.   Emmauel is here today for a routine follow-up after starting MET in mid-May. Follow-up KUB from 06/25/17 shows a possible calcification in the distal left ureter. Today, the patient states that he is having mild left flank pain, but is also having a lot of urgency/frequency. UA shows microscopic hematuria.   RUS from 07/16/17 shows no evidence of hydronephrosis-possible extra-renal pelvis.   Past Medical History:  Diagnosis Date  . Allergic rhinitis, seasonal   . Anxiety   . History of kidney stones   . Hypertriglyceridemia   . Hypothyroidism   . Obstruction of left ureteropelvic junction (UPJ) due to stone     History reviewed. No pertinent surgical history.  Allergies: No Known Allergies  Family History  Problem Relation Age of Onset  . Allergic rhinitis Sister   . Angioedema Neg Hx   . Asthma Neg Hx   . Atopy Neg Hx   . Eczema Neg Hx   . Immunodeficiency Neg Hx   . Urticaria Neg Hx     Social History:  reports that he has been smoking cigarettes.  He has a 25.00 pack-year smoking history. He quit smokeless tobacco use about 4 years ago. His smokeless tobacco use included chew. He reports that he does not drink alcohol or use drugs.  ROS: A complete review of systems was performed.  All systems are negative except for pertinent findings as noted.  Physical Exam:  Vital signs in last 24 hours:   Constitutional:  Alert and oriented, No acute distress Cardiovascular: Regular rate and rhythm, No JVD Respiratory: Normal respiratory effort, Lungs clear bilaterally GI: Abdomen is soft, nontender, nondistended, no abdominal masses GU: No CVA tenderness Lymphatic: No  lymphadenopathy Neurologic: Grossly intact, no focal deficits Psychiatric: Normal mood and affect  Laboratory Data:  No results for input(s): WBC, HGB, HCT, PLT in the last 72 hours.  No results for input(s): NA, K, CL, GLUCOSE, BUN, CALCIUM, CREATININE in the last 72 hours.  Invalid input(s): CO3   No results found for this or any previous visit (from the past 24 hour(s)). No results found for this or any previous visit (from the past 240 hour(s)).  Renal Function: No results for input(s): CREATININE in the last 168 hours. CrCl cannot be calculated (Patient's most recent lab result is older than the maximum 21 days allowed.).  Radiologic Imaging: No results found.  I independently reviewed the above imaging studies.  Assessment and Plan SAKAI WOLFORD is a 42 y.o. male with a 3 mm left ureteral stone associated with left flank pain and urinary urgency  -The risks, benefits and alternatives of cystoscopy with LEFT ureteroscopy, laser lithotripsy and ureteral stent placement was discussed the patient. Risks included, but are not limited to: bleeding, urinary tract infection, ureteral injury/avulsion, ureteral stricture formation, retained stone fragments, the possibility that multiple surgeries may be required to treat the stone(s), MI, stroke, PE and the inherent risks of general anesthesia. The patient voices understanding and wishes to proceed.  We discussed criteria to return to clinic or proceed to the ER which include: Fever/chills, worsening pain, nausea/vomiting and/or persistent gross hematuria.   Ellison Hughs, MD 08/05/2017, 9:59 AM  [facility] Pager: [phone]

## 2017-08-06 ENCOUNTER — Encounter (HOSPITAL_BASED_OUTPATIENT_CLINIC_OR_DEPARTMENT_OTHER): Payer: Self-pay | Admitting: Urology

## 2017-08-07 NOTE — Anesthesia Postprocedure Evaluation (Signed)
Anesthesia Post Note  Patient: Luis Graham  Procedure(s) Performed: CYSTOSCOPY/URETEROSCOPY/HOLMIUM LASER/STENT PLACEMENT (Left Ureter)     Patient location during evaluation: PACU Anesthesia Type: General Level of consciousness: awake Pain management: pain level controlled Vital Signs Assessment: post-procedure vital signs reviewed and stable Respiratory status: spontaneous breathing Cardiovascular status: stable Postop Assessment: no apparent nausea or vomiting Anesthetic complications: no    Last Vitals:  Vitals:   08/05/17 1400 08/05/17 1510  BP: 124/89 127/89  Pulse: 87 79  Resp: (!) 33 20  Temp:  36.6 C  SpO2: 97% 98%    Last Pain:  Vitals:   08/06/17 1340  TempSrc:   PainSc: 4    Pain Goal: Patients Stated Pain Goal: 4 (08/05/17 1048)               [place]

## 2018-10-15 LAB — ALLERGY TOTAL IGE: ALLERGY TOT IGE: 62

## 2018-10-15 LAB — ALLERGEN, RESPIRATORY, REGION 5 PANEL
ALLERGEN CLADOSPORIUM HERBARUM: <0.1 kU/L (ref 0.00–0.35)
ALLERGEN PENICILLIUM NOTATUM: <0.1 kU/L (ref 0.00–0.35)
Allergen Birch IgE: <0.1 kU/L (ref 0.00–0.35)
Allergen, Grass, Bahia, IgE: 2.63 kU/L — ABNORMAL HIGH (ref 0.00–0.35)
Aspergillus alternata IgE: <0.1 kU/L (ref 0.00–0.35)
Aspergillus fumigatus IgE: <0.1 kU/L (ref 0.00–0.35)
Bermuda Grass IgE: 1.02 kU/L — ABNORMAL HIGH (ref 0.00–0.35)
Cat Dander IgE: 0.12 kU/L (ref 0.00–0.35)
Cockroach IgE: <0.1 kU/L (ref 0.00–0.35)
Common-Short Ragweed IgE: 0.2 kU/L (ref 0.00–0.35)
D. farinae IgE: <0.1 kU/L (ref 0.00–0.35)
Dog Dander IgE: <0.1 kU/L (ref 0.00–0.35)
Elm IgE: <0.1 kU/L (ref 0.00–0.35)
Immunoglobulin E: 62.5 kU/L (ref 0.00–100.00)
Mite Dust Pteronyssinus IgE: <0.1 kU/L (ref 0.00–0.35)
Mouse Urine Proteins: <0.1 kU/L (ref 0.00–0.35)
Nettle: <0.1 kU/L (ref 0.00–0.35)
Oak: 0.13 kU/L (ref 0.00–0.35)
Pecan Tree IgE: <0.1 kU/L (ref 0.00–0.35)
Rough Pigweed  IgE: <0.1 kU/L (ref 0.00–0.35)
Sheep Sorrel IgE: 0.11 kU/L (ref 0.00–0.35)
Timothy Grass, IgE: 2.72 kU/L — ABNORMAL HIGH (ref 0.00–0.35)

## 2018-10-22 LAB — COMPREHENSIVE METABOLIC PANEL
ALT: 35 U/L (ref 0–41)
AST: 30 U/L (ref 0–40)
Albumin/Globulin Ratio: 1.8 mmol/L (ref 1.00–2.00)
Albumin: 4.7 g/dL (ref 3.5–5.2)
Alk Phosphatase: 69 U/L (ref 40–130)
Anion Gap: 14 mmol/L (ref 2–17)
BUN: 19 mg/dL (ref 6–20)
CO2: 24 mmol/L (ref 22–29)
Calcium: 9.5 mg/dL (ref 8.6–10.0)
Chloride: 103 mmol/L (ref 98–107)
Creatinine: 1 mg/dL (ref 0.7–1.3)
GFR African American: 106 mL/min/{1.73_m2} (ref >=90)
GFR Non-African American: 92 mL/min/{1.73_m2} (ref >=90)
Globulin: 3 g/dL (ref 1.9–4.4)
Glucose: 69 mg/dL — ABNORMAL LOW (ref 70–99)
OSMOLALITY CALCULATED: 282 mOsm/kg (ref 270–287)
Potassium: 4.2 mmol/L (ref 3.5–5.3)
Sodium: 141 mmol/L (ref 135–145)
Total Bilirubin: 0.3 mg/dL (ref 0.00–1.20)
Total Protein: 7.3 g/dL (ref 6.4–8.3)

## 2018-10-22 LAB — CBC
Hematocrit: 46.5 % (ref 38.0–52.0)
Hemoglobin: 15.6 g/dL (ref 13.0–17.3)
MCH: 28.8 pg (ref 27.0–34.5)
MCHC: 33.5 g/dL (ref 32.0–36.0)
MCV: 86 fL (ref 84.0–100.0)
MPV: 9.3 fL (ref 7.2–13.2)
NRBC Absolute: 0 10*3/uL (ref 0.000–0.012)
NRBC Automated: 0 % (ref 0.0–0.2)
Platelets: 372 10*3/uL (ref 140–440)
RBC: 5.41 x10e6/mcL (ref 4.00–5.60)
RDW: 14.6 % (ref 11.0–16.0)
WBC: 11.6 10*3/uL — ABNORMAL HIGH (ref 3.8–10.6)

## 2018-10-22 LAB — LIPID PANEL
Chol/HDL Ratio: 6.6 — ABNORMAL HIGH (ref 0.0–4.4)
Cholesterol: 257 mg/dL — ABNORMAL HIGH (ref 100–200)
HDL: 39 mg/dL — ABNORMAL LOW (ref 55–72)
LDL Cholesterol: 170.4 mg/dL — ABNORMAL HIGH (ref 0.0–100.0)
LDL/HDL Ratio: 4.4
Triglycerides: 238 mg/dL — ABNORMAL HIGH (ref 0–149)
VLDL: 47.6 mg/dL — ABNORMAL HIGH (ref 5.0–40.0)

## 2018-10-22 LAB — TSH WITH REFLEX TO FT4: TSH: >100 mcIU/mL — ABNORMAL HIGH (ref 0.358–3.740)

## 2018-10-25 LAB — T4, FREE: T4 Free: 0.21 ng/dL — ABNORMAL LOW (ref 0.82–1.70)

## 2018-12-04 LAB — TSH WITH REFLEX TO FT4: TSH: 0.331 mcIU/mL — ABNORMAL LOW (ref 0.358–3.740)

## 2018-12-04 LAB — T4, FREE: T4 Free: 1.75 ng/dL — ABNORMAL HIGH (ref 0.82–1.70)

## 2019-01-19 LAB — CBC WITH AUTO DIFFERENTIAL
Basophils %: 0.1 % (ref 0.0–2.0)
Basophils Absolute: 0 10*3/uL (ref 0.0–0.2)
Eosinophils %: 0.4 % (ref 0.0–7.0)
Eosinophils Absolute: 0 10*3/uL (ref 0.0–0.5)
Hematocrit: 44.2 % (ref 38.0–52.0)
Hemoglobin: 15.3 g/dL (ref 13.0–17.3)
Immature Grans (Abs): 0.03 10*3/uL (ref 0.00–0.06)
Immature Granulocytes %: 0.4 % (ref 0.1–0.6)
Lymphocytes Absolute: 1.2 10*3/uL (ref 1.0–3.2)
Lymphocytes: 14.4 % — ABNORMAL LOW (ref 15.0–45.0)
MCH: 29.3 pg (ref 27.0–34.5)
MCHC: 34.6 g/dL (ref 32.0–36.0)
MCV: 84.7 fL (ref 84.0–100.0)
MPV: 9.3 fL (ref 7.2–13.2)
Monocytes %: 8.8 % (ref 4.0–12.0)
Monocytes Absolute: 0.7 10*3/uL (ref 0.3–1.0)
Neutrophils %: 75.9 % — ABNORMAL HIGH (ref 42.0–74.0)
Neutrophils Absolute: 6.2 10*3/uL (ref 1.6–7.3)
Platelets: 189 10*3/uL (ref 140–440)
RBC: 5.22 x10e6/mcL (ref 4.00–5.60)
RDW: 12.3 % (ref 11.0–16.0)
WBC: 8.2 10*3/uL (ref 3.8–10.6)

## 2019-01-19 LAB — TROPONIN T: Troponin T: <0.01 ng/mL (ref 0.000–0.010)

## 2019-01-19 LAB — BASIC METABOLIC PANEL
Anion Gap: 12 mmol/L (ref 2–17)
BUN: 12 mg/dL (ref 6–20)
CO2: 24 mmol/L (ref 22–29)
Calcium: 9 mg/dL (ref 8.6–10.0)
Chloride: 98 mmol/L (ref 98–107)
Creatinine: 1 mg/dL (ref 0.7–1.3)
GFR African American: 106 mL/min/{1.73_m2} (ref >=90)
GFR Non-African American: 92 mL/min/{1.73_m2} (ref >=90)
Glucose: 97 mg/dL (ref 70–99)
Osmolaliy Calculated: 268 mosm/kg — ABNORMAL LOW (ref 270–287)
Potassium: 5 mmol/L (ref 3.5–5.3)
Sodium: 134 mmol/L — ABNORMAL LOW (ref 135–145)

## 2019-01-19 LAB — N TERMINAL PROBNP (AKA NTPROBNP): NT Pro-BNP: <5 pg/mL (ref 0–125)

## 2019-01-19 LAB — TROPONIN: Trop 5th Gen Study: <6 ng/L (ref 0.0–12.0)

## 2019-01-19 LAB — D-DIMER, QUANTITATIVE: D-Dimer, Quant: <=0.27 ug{FEU}/mL (ref 0.19–0.51)

## 2019-01-19 LAB — ADD ON LAB TEST

## 2019-01-19 NOTE — ED Notes (Signed)
 ED Patient Summary       ;          [facility]  [address] [place], [place] [address]  [phone]  Discharge Instructions (Patient)  _______________________________________     Jacob Yu  DOB:  [birth year]                   MRN: [id number]                   FIN: [id number]  Reason For Visit: Bloating; Shortness of breath; COVID + / WEAK/D/F/N/V  Final Diagnosis: Anxiety; COVID-19; Dyspnea; Nausea     Visit Date: 01/19/2019 14:38:00  Address: [address] [place] [address]  Phone: [phone]     Emergency Department Providers:         Primary Physician:            [facility] would like to thank you for allowing us  to assist you with your healthcare needs. The following includes patient education materials and information regarding your injury/illness.     Follow-up Instructions:  You were seen today on an emergency basis. Please contact your primary care doctor for a follow up appointment. If you received a referral to a specialist doctor, it is important you follow-up as instructed.    It is important that you call your follow-up doctor to schedule and confirm the location of your next appointment. Your doctor may practice at multiple locations. The office location of your follow-up appointment may be different to the one written on your discharge instructions.    If you do not have a primary care doctor, please call [phone] for help in finding a [facility] Provider. For help in finding a specialist doctor, please call [phone].    The [facility] "Ask a Nurse" line in staffed by Registered Nurses and is a free service to the community. We are available Monday - Friday from 8am to 5pm to answer your questions about your health. Please call [phone].    If your condition gets worse before your follow-up with your primary care doctor or specialist, please return to the Emergency  Department.        Follow Up Appointments:  Primary Care Provider:      Name: PCP,  NONE      Phone:                  With: Address: When:   Follow up with primary care provider  Within 1 week   Comments:   Continue to quarantine yourself to ensure that you do not infect others.   If you develop any new or changing symptoms including fevers, shortness of breath, other new or changing symptoms, return to the emergency department for evaluation.     Take Zofran for nausea relief   I recommend supportive measures, including increased fluid intake, pain control with Motrin or Tylenol, mucinex-DM.              Printed Prescriptions:    Patient Education Materials:  Discharge Orders          Discharge Patient 01/19/19 17:10:00 EST         Comment:       COVID Discharge Instructions (CUSTOM); Shortness of Breath, Easy-to-Read; Generalized Anxiety Disorder; Nausea, Adult, Easy-to-Read  COVID-19 Testing, Precautions, & Infection  q Your COVID-19 lab test result is PENDING.   Your COVID-19 lab result is not available at this time. Please allow up to 7 days for processing and continue to self-isolate at home for the next 10 days and no fever last 24 hours of isolation or unless you have a confirmed negative result (not detected). If your result is positive (detected) you will receive a phone call from [facility]. [employer]. Please follow all other discharge instructions given to you by your healthcare provider.    q Your COVID-19 lab test result is POSITIVE.    Your COVID-19 lab result is positive (detected) for the COVID-19 infection. Please follow all other discharge instructions given to you by your healthcare provider.  You must self-isolate at home for 10 days and no fever last 24 hours of isolation starting the day that you first began to feel sick. Minimize contact with others, including people that live in the same house.    q Your COVID-19 lab test result is NEGATIVE.   Your COVID-19 lab  result is negative (not detected) for the COVID-19 infection. Please follow all other discharge instructions given to you by your healthcare provider    q Your COVID-19 antibody lab test is REACTIVE.   Your COVID-19 antibody lab result is reactive to the SARS-CoV-2 antibodies, the virus that causes the COVID-19 infection. A reactive result to the antibody test means that you have most likely had a previous COVID-19 infection but, DOES NOT mean that you have immunity from a future COVID-19 infection. Please follow all other discharge instructions given to you by your healthcare provider. If you have any additional questions about your reactive antibody result call your 'Primary Care Physician'.   q Your COVID-19 antibody lab test is NON-REACTIVE.   Your COVID-19 antibody lab result is non-reactive to the SARS-CoV-2 antibodies, the virus that causes the COVID-19 infection. A non-reactive result to the antibody test means that you HAVE NOT   had a previous COVID-19 infection. Please follow all other discharge instructions given to you by your healthcare provider. If you have any additional questions about your non-reactive antibody result call your 'Primary Care Physician'.  **** The quickest way to view or print your own test result is by the 'Patient Portal' at [email or web address] see next page for further 'Patient Portal' details.  Note: In reviewing your COVID-19 result on the patient portal (under the 'results' tab) you may notice a variation in the test name that includes, "Coronavirus", "cNoV", "Corona", "SARS", or "COV2". These all relate to a COVID-19 test that you had performed at a [facility] [employer] location.  How to Access the [facility] Patient Portal  1. Go to: [email or web address]  2. You want the option of:  [facility]   If you do not have an account and are visiting the portal for the first time, select "Sign up now".   If you already have an account, select "Log into  [facility]".       3. If you are signing up for a new account you will fill out a "Self-Enrollment for [facility]" page in which you will securely enter your first, last name, date of birth, social security number, and an identity verification prompt. *Tip:  when filling out the "Self-Enrollment for [facility]" enter your name using the same spelling that is on file with your physician's office or hospital. Once this page is filled out, hit the purple "Next" button  at the bottom of the page.   4. Verify your information and click the purple "Next Create Your Account" button.   5. After identity verification, you will create your username and password for your "[facility]" Patient Portal. You will then click the green "Create Account" button.   6. Once your account has been created, you will be taken back to a login screen. Log in with the username and password that you created in step 5.     ! If you need additional assistance with the [facility] Patient Portal, you may call the [facility] at [phone].      What does self-isolate mean?   Avoid leaving the house unless seeking healthcare treatment. Things that you should NOT do include:  work, school, church, restaurants, and social gatherings   Do not share utensils, drinking glasses, towels, or bedding with other people   Wash/sanitize your hands frequently and avoid touching your face.   Frequently clean "high-touch" surfaces (e.g. counter tops, doorknobs, bathroom fixtures, phones, tablets, and keyboards).   Try to remain 6 feet away from others as much as possible.   Avoid sharing confined spaces with others as much as possible.  If you live in your home with other people, consider keeping a separate bedroom and bathroom just for your use.  What If My Symptoms Get Worse?  Follow-up with your doctor or return to the ER for any difficulty breathing or other worsening symptoms.  You may also  see a doctor from home by going to [email or web address]      CDC COVID-19 Resources   For more information, visit.  [email or web address]se       COVID-Related Education from the CDC  (Scan these codes with an Apple or Android device)           Prevent the spread of    What you should know  COVID-19 if you are sick:  about COVID-19 to        protect yourself and        others:                                                         COVID-19: Quarantine       10 things you can do to  vs. Isolation:         manage your COVID-19             symptoms at home:                                     What is COVID-19?  COVID-19 stands for coronavirus disease 2019. It is caused by a virus called SARS-CoV-2. The virus first appeared in late 2019 and quickly spread around the world.  People with COVID-19 can have fever, cough, trouble breathing, and other symptoms. Problems with breathing happen when the infection affects the lungs and causes pneumonia.  Most people who get COVID-19 will not get severely ill. But some do. In many areas, people have been told to stay home and away from other people. This is to try to slow the spread of the virus.  How is COVID-19 Spread?    The virus that causes COVID-19  mainly spreads from person to person. This usually happens when an infected person coughs, sneezes, or talks near other people. The virus can be passed easily between people who live together. But it can also spread at gatherings where people are talking close together, shaking hands, hugging, sharing food, or even singing together. Doctors also think it is possible to get sick if you touch a surface that has the virus on it and then touch your mouth, nose, or eyes.  A person can be infected, and spread the virus to others, even without having any symptoms. This is why keeping people apart is one of the best ways to slow the spread.  It is also possible for the virus to spread from an infected person to  an animal, like a pet. But this seems to be uncommon. There is no evidence that a person could get the virus from a pet.  Experts do not think the virus is spread through food like some other viruses. There is also no evidence that it can be spread through the water in a pool or hot tub. But because the virus can spread when people are close together, things like swimming in a crowded pool are still risky.  What are the Symptoms of COVID-19?    Symptoms usually start 4 or 5 days after a person is infected with the virus. But in some people, it can take up to 2 weeks for symptoms to appear. Some people never show symptoms at all.  When symptoms do happen, they can include:    Fever   Cough   Trouble breathing   Feeling tired   Shaking chills   Muscle aches   Headache   Sore throat   Problems with sense of smell or taste         Some people have digestive problems like nausea or diarrhea. There have also been some reports of rashes or other skin symptoms. For example, some people with COVID-19 get reddish-purple spots on their fingers or toes. But it's not clear why or how often this happens.  For most people, symptoms will get better within a few weeks. But in others, COVID-19 can lead to serious problems like pneumonia, not getting enough oxygen, heart problems, or even death. This risk gets higher as people get older. It is also higher in people who have other health problems like serious heart disease, chronic kidney disease, type 2 diabetes, chronic obstructive pulmonary disease (COPD), sickle cell disease, or obesity. People who have a weak immune system for other reasons (for example, HIV infection or certain medicines), asthma, cystic fibrosis, type 1 diabetes, or high blood pressure might also be at higher risk for serious problems.    What should I do if I have symptoms?  If you have a fever, cough, trouble breathing, or other symptoms of COVID-19, call your doctor or nurse. They will ask about your  symptoms. They might also ask about any recent travel and whether you have been around anyone who might be sick.  If your symptoms are not severe, it is best to call before you go in. The staff can tell you what to do and whether you need to be seen in person. Many people with only mild symptoms should stay home and avoid other people until they get better. If you do need to go to the clinic or hospital, cover your nose and mouth with cloth. This helps protect other people. The staff might also have you wait  someplace away from other people.  If you are severely ill and need to go to the clinic or hospital right away, you should still call ahead if possible. This way the staff can care for you while taking steps to protect others. If you think you are having a medical emergency, dial 9-1-1.  Is there a test for the virus that causes COVID-19?  Yes. If your doctor or nurse suspects you have COVID-19, they might take a swab from inside your nose for testing. If you are coughing up mucus, they might also test a sample of the mucus. These tests can help your doctor figure out if you have COVID-19 or another illness.  In some areas, it might not be possible to test everyone who might have been exposed to the virus. If your doctor cannot test you, they might tell you to stay home, avoid other people, and call if your symptoms get worse.  There is also a blood test that can show if a person has had COVID-19 in the past. This is called an antibody test. Over time, this could help experts understand how many people were infected without knowing it. Experts are also using blood tests to study whether a person who has had COVID-19 could get it again.  How is COVID-19 treated?  Many people will be able to stay home while they get better. But people with serious symptoms or other health problems might need to go to the hospital.  Mild illness - Mild illness means you might have symptoms like fever and cough, but you do not  have trouble breathing. Most people with COVID-19 have mild illness and can rest at home until they get better. This usually takes about 2 weeks, but it's not the same for everyone.  If you are recovering from COVID-19, it's important to stay home and self-isolate until your doctor or nurse tells you it's safe to go back to your normal activities. Self-isolation means staying apart from other people, even the people you live with. When you can stop self-isolation will depend on how long it has been since you had symptoms, and in some cases, whether you have had a negative test (showing that the virus is no longer in your body).  Severe illness - If you have more severe illness with trouble breathing, you might need to stay in the hospital, possibly in the intensive care unit (also called the ICU). While you are there, you will most likely be in a special isolation room. Only medical staff will be allowed in the room, and they will have to wear special gowns, gloves, masks, and eye protection.The doctors and nurses can monitor and support your breathing and other body functions and make you as comfortable as possible. You might need extra oxygen to help you breathe easily. If you are having a very hard time breathing, you might need to be put on a ventilator. This is a machine to help you breathe.  Doctors are studying several possible treatments for COVID-19. In certain cases, doctors might recommend medicines that seem to help some people who are severely ill. They also might recommend being part of a clinical trial. A clinical trial is a scientific study that tests new medicines to see how well they work. Do not try any new medicines or treatments without talking to a doctor.  Can COVID-19 be prevented?  There is not yet a vaccine to prevent COVID-19. But there are things you can do to help slow  the spread. These steps are a good idea for everyone, especially in areas where the infection has spread very  quickly. But they are extra important for people who are older or who have other health problems.  To help protect yourself and others:  Practice social distancing. It's most important to avoid contact with people who are sick. But social distancing also means staying away from all people who do not live in your household. It is sometimes called physical distancing.  Avoiding crowds is an important part of social distancing. But even small gatherings can be risky, so it's best to stay home as much as you can. When you do need to go out, try your best to stay at least 6 feet (about 2 meters) away from other people.  Wear a cloth face mask when you need to go out. Experts in many countries recommend doing this. It is mostly so that if you are sick, even if you don't have any symptoms, you are less likely to spread the infection to other people. You can use a cloth or homemade mask to cover your mouth and nose. In most cases, experts recommend leaving medical masks for health workers.  When you take your cloth mask off, make sure you do not touch your eyes, nose, or mouth. And wash your hands after you touch the mask. You can wash the cloth mask with the rest of your laundry.  Wash your hands with soap and water often and Avoid touching your face, especially your mouth, nose, and eyes. This is especially important after being out in public, getting your mail, or touching packages or other deliveries. The risk of getting infected by touching items like this is not well known, but is probably not very high. Still, it's a good idea to wash your hands often.    Make sure to rub your hands with soap for at least 20 seconds, cleaning your wrists, fingernails, and in between your fingers. Then rinse your hands and dry them with a paper towel you can throw away. If you are not near a sink, you can use a hand sanitizing gel to clean your hands. The gels with at least 60 percent alcohol work the best. But it is better to  wash with soap and water if you can.  Avoid traveling if you can. Some experts recommend not traveling to or from certain areas where there are a lot of cases of COVID-19. But any form of travel, especially if you spend time in crowded places like airports, increases your risk. If lots of people travel, it also makes it more likely that the virus will spread to more parts of the world.    Why is social distancing so important?  Keeping people away from each other is one of the best ways to control the spread of the virus that causes COVID-19. That's because the virus can spread easily through close contact, and it's not always possible to know who is infected.  Different areas have different rules. In many places, schools and businesses are closed, and events have been canceled or postponed. But social distancing is not just about avoiding big crowds. The safest thing to do is to avoid any gatherings with people from outside your household, even in small groups. Many people find it helpful to stay in touch with friends and relatives in other ways, like over the phone or online. If you have outdoor space, or can take a walk without getting near other people,  it can also help to get fresh air when you are able.  When experts recommend staying home, it's important to take this seriously and follow instructions as best you can. If you do need to be around other people, keep in mind that:   The virus can spread both indoors and outdoors. But being outdoors is probably less risky.   The more people you come into contact with, and the more often you do this, the higher the risk of spreading the virus.   Washing your hands often, staying 6 feet (2 meters) away from people, and wearing a cloth mask will all help lower the risk to you and others.  It's hard having to change your life and habits, and it's normal to want things to get back to the way they used to be. But remember, even if you do not get very sick from  COVID-19, you could still spread it to others who could get very sick. If people stop social distancing too soon, more people will get sick.  What should I do if someone in my home has COVID-19?  If someone in your home has COVID-19, there are additional things you can do to protect yourself and others:   Keep the sick person away from others - The sick person should stay in a separate room, and use a different bathroom if possible. They should also eat in their own room.   Experts also recommend that the person stay away from pets in the house until they are better.   Have them cover their face - The sick person should cover their nose and mouth with a cloth mask when they are in the same room as other people. If they can't use a face cover, you can help protect yourself by covering your face when you are in the room with them.   Wash hands [employer] your hands with soap and water often (see above).   Clean often - Here are some specific things that can help:  Wear disposable gloves when you clean. It's also a good idea to wear gloves when you have to touch the sick person's laundry, dishes, utensils, or trash.  When you do the sick person's laundry, avoid letting dirty clothes or bedding touch your body. Wash your hands and clean the outside of the washer after putting in the laundry.  Regularly clean things that are touched a lot. This includes counters, bedside tables, doorknobs, computers, phones, and bathroom surfaces.  Clean things in your home with soap and water, but also use disinfectants on appropriate surfaces. Some cleaning products work well to kill bacteria, but not viruses, so it's important to check labels.                     Shortness of Breath    Shortness of breath means you have trouble breathing. Shortness of breath needs medical care right away.      HOME CARE     Do not smoke.      Avoid being around chemicals or things (paint fumes, dust) that may bother your breathing.      Rest as  needed. Slowly begin your normal activities.     Only take medicines as told by your doctor.     Keep all doctor visits as told.    GET HELP RIGHT AWAY IF:     Your shortness of breath gets worse.     You feel lightheaded, pass out (faint), or  have a cough that is not helped by medicine.     You cough up blood.     You have pain with breathing.     You have pain in your chest, arms, shoulders, or belly (abdomen).     You have a fever.     You cannot walk up stairs or exercise the way you normally do.     You do not get better in the time expected.     You have a hard time doing normal activities even with rest.     You have problems with your medicines.     You have any new symptoms.    MAKE SURE YOU:     Understand these instructions.     Will watch your condition.     Will get help right away if you are not doing well or get worse.    This information is not intended to replace advice given to you by your health care provider. Make sure you discuss any questions you have with your health care provider.    Document Released: 07/02/2007 Document Revised: 01/18/2013 Document Reviewed: 03/31/2011  Elsevier Interactive Patient Education ?2016 [employer]       Generalized Anxiety Disorder    Generalized anxiety disorder (GAD) is a mental disorder. It interferes with life functions, including relationships, work, and school.    GAD is different from normal anxiety, which everyone experiences at some point in their lives in response to specific life events and activities. Normal anxiety actually helps us  prepare for and get through these life events and activities. Normal anxiety goes away after the event or activity is over.     GAD causes anxiety that is not necessarily related to specific events or activities. It also causes excess anxiety in proportion to specific events or activities. The anxiety associated with GAD is also difficult to control. GAD can vary from mild to severe. People with severe GAD can have  intense waves of anxiety with physical symptoms (panic attacks).     SYMPTOMS    The anxiety and worry associated with GAD are difficult to control. This anxiety and worry are related to many life events and activities and also occur more days than not for 6 months or longer. People with GAD also have three or more of the following symptoms (one or more in children):     Restlessness. ?     Fatigue.     Difficulty concentrating. ?     Irritability.     Muscle tension.     Difficulty sleeping or unsatisfying sleep.    DIAGNOSIS    GAD is diagnosed through an assessment by your health care provider. Your health care provider will ask you questions about?your mood,?physical symptoms, and events in your life. Your health care provider may ask you about your medical history and use of alcohol or drugs, including prescription medicines. Your health care provider may also do a physical exam and blood tests. Certain medical conditions and the use of certain substances can cause symptoms similar to those associated with GAD. Your health care provider may refer you to a mental health specialist for further evaluation.    TREATMENT    The following therapies are usually used to treat GAD:      Medication. Antidepressant medication usually is prescribed for long-term daily control. Antianxiety medicines may be added in severe cases, especially when panic attacks occur. ?     Talk therapy (psychotherapy). Certain types of  talk therapy can be helpful in treating GAD by providing support, education, and guidance. A form of talk therapy called cognitive behavioral therapy can teach you healthy ways to think about and react to daily life events and activities.      Stress management?techniques. These include yoga, meditation, and exercise and can be very helpful when they are practiced regularly.    A mental health specialist can help determine which treatment is best for you. Some people see improvement with one therapy. However,  other people require a combination of therapies.    This information is not intended to replace advice given to you by your health care provider. Make sure you discuss any questions you have with your health care provider.    Document Released: 05/10/2012 Document Revised: 02/03/2014 Document Reviewed: 05/10/2012  Elsevier Interactive Patient Education ?2016 Elsevier Inc.       Nausea, Adult    Nausea means you feel sick to your stomach or need to throw up (vomit). It may be a sign of a more serious problem. If nausea gets worse, you may throw up. If you throw up a lot, you may lose too much body fluid (dehydration).      HOME CARE     Get plenty of rest.     Ask your doctor how to replace body fluid losses (rehydrate).     Eat small amounts of food. Sip liquids more often.     Take all medicines as told by your doctor.    GET HELP RIGHT AWAY IF:     You have a fever.     You pass out (faint).     You keep throwing up or have blood in your throw up.     You are very weak, have dry lips or a dry mouth, or you are very thirsty (dehydrated).     You have dark or bloody poop (stool).     You have very bad chest or belly (abdominal) pain.     You do not get better after 2 days, or you get worse.     You have a headache.    MAKE SURE YOU:     Understand these instructions.     Will watch your condition.     Will get help right away if you are not doing well or get worse.    This information is not intended to replace advice given to you by your health care provider. Make sure you discuss any questions you have with your health care provider.    Document Released: 01/02/2011 Document Revised: 04/07/2011 Document Reviewed: 09/19/2014  Elsevier Interactive Patient Education ?2016 Elsevier Inc.         Allergy Info: No Known Medication Allergies     Medication Information:  [facility] Phys[facility] provided you with a complete list of medications post discharge, if you have been instructed to stop  taking a medication please ensure you also follow up with this information to your Primary Care Physician.  Unless otherwise noted, patient will continue to take medications as prescribed prior to the Emergency Room visit.  Any specific questions regarding your chronic medications and dosages should be discussed with your physician(s) and pharmacist.          levocetirizine (levocetirizine 5 mg oral tablet)  levothyroxine  (Synthroid  112 mcg (0.112 mg) oral tablet) 1 Tabs Oral (given by mouth) every day.  ondansetron (Zofran 4 mg oral tablet) 1 Tabs Oral (given by mouth)  every 4 hours as needed nausea for 3 Days. Refills: 0.  tiZANidine  (tiZANidine  2 mg oral tablet)      Medications Administered During Visit:              Medication Dose Route   Sodium Chloride 0.9% 1000 mL IV Piggyback   ondansetron 4 mg IV Push   dicyclomine 20 mg Oral          Major Tests and Procedures:  The following procedures and tests were performed during your ED visit.  PROCEDURES%>  PROCEDURES COMMENTS%>          Laboratory Orders  Name Status Details   .Trop 5th Gen Completed Blood, Stat, ST - Stat, Collected, 01/19/19 16:09:00 EST, 01/19/19 16:09:00 EST, Nurse collect, 01/19/19 16:09:00 EST, CALDWELL-PAC,  KELSEY C, [id number]   Add On Completed Blood, Stat, ST - Stat, Collected, 01/19/19 16:03:00 EST, 01/19/19 16:03:00 EST, CALDWELL-PAC,  KELSEY C, Print label Y/N, d-dimer, Draw Stat   BMP Completed Blood, Stat, ST - Stat, 01/19/19 15:53:00 EST, 01/19/19 15:53:00 EST, Nurse collect, CALDWELL-PAC,  KELSEY C, Print label Y/N   CBCDIFF Completed Blood, Stat, ST - Stat, 01/19/19 15:53:00 EST, 01/19/19 15:53:00 EST, Nurse collect, CALDWELL-PAC,  KELSEY C, Print label Y/N   D Dimer Completed Blood, Stat, ST - Stat, Collected, 01/19/19 16:09:00 EST Mar, RN, Aleck, 01/19/19 16:09:00 EST, Nurse collect, Venous Draw, 01/19/19 16:16:00 EST, BH CP Login, CALDWELL-PAC,  KELSEY C, Print label Y/N, bh1_spec_lbl1, 1.8 mL Blue/*C*/   NTproBNP  Completed Blood, Stat, ST - Stat, 01/19/19 15:53:00 EST, 01/19/19 15:53:00 EST, Nurse collect, CALDWELL-PAC,  KELSEY C, Print label Y/N   Trop T Completed Blood, Stat, ST - Stat, 01/19/19 15:53:00 EST, 01/19/19 15:53:00 EST, Nurse collect, CALDWELL-PAC,  KELSEY C, Print label Y/N   XTube Blue Completed Blood, Stat, ST - Stat, Collected, 01/19/19 16:09:00 EST [id number], 01/19/19 16:09:00 EST, Nurse collect, Venous Draw, 01/19/19 16:16:00 EST, BH CP Login, CALDWELL-PAC,  KELSEY C, Print label Y/N, bh1_spec_lbl1, 1.8 mL Blue/*[id number]*/, Complete               Radiology Orders  Name Status Details   XR Chest 1 View Portable Completed 01/19/19 15:05:00 EST, STAT 1 hour or less, Reason: Shortness of breath  (SOB), Transport Mode: Portable, pp_set_radiology_subspecialty               Patient Care Orders  Name Status Details   COVID-19 Status Ordered 01/19/19 14:52:47 EST, NOT VALID FOR pharmacy, laboratory, radiology., 01/19/19 14:52:47 EST, COVID-19 Detected   Cardiac/NIBP/Pulse Ox Monitoring Completed 01/19/19 15:05:00 EST, This message can only be seen by Nursing, it is not visible to Pharmacy, Laboratory, or Radiology., 01/19/19 15:05:00 EST, 01/19/19 15:05:00 EST, Once   Discharge Patient Ordered 01/19/19 17:10:00 EST   ED Assessment Adult Completed 01/19/19 14:52:47 EST, 01/19/19 14:52:47 EST   ED Secondary Triage Completed 01/19/19 14:52:47 EST, 01/19/19 14:52:47 EST   ED Triage Adult Completed 01/19/19 14:38:20 EST, 01/19/19 14:38:20 EST   Notify Provider Completed 01/19/19 14:52:47 EST, This message can only be seen by Nursing, it is not visible to Pharmacy, Laboratory, or Radiology., 01/19/19 14:52:47 EST   Place Isolation Order Ordered 01/19/19 14:52:48 EST, 01/19/19 14:52:48 EST   Saline Lock Insert Completed 01/19/19 15:53:00 EST, Once, 01/19/19 15:53:00 EST       ---------------------------------------------------------------------------------------------------------------------  [facility] encourages you to self-enroll in the [facility] Patient Portal.  The [facility] Patient Portal will allow you to manage your personal health information securely from  your own electronic device now and in the future.  To begin your Patient Portal enrollment process, please visit [email or web address] Click on "Sign up now" under [facility].  If you find that you need additional assistance on the [facility] Patient Portal or need a copy of your medical records, please call the [facility] Medical Records Office at [phone].  Comment:

## 2019-01-19 NOTE — ED Provider Notes (Signed)
 COVID-19+ - SOB r        Patient:   Jacob Yu, Jacob Yu             MRN: [id number]            FIN: [id number]               Age:   43 years     Sex:  Male     DOB:  1975/07/03   Associated Diagnoses:   Dyspnea; COVID-19; Nausea; Anxiety   Author:   REENA LARRAINE BROCKS      Basic Information   Time seen: Provider Seen (ST)   ED Provider/Time:    CALDWELL-PAC,  KELSEY C / 01/19/2019 14:43  .   Additional information: Chief Complaint from Nursing Triage Note   Chief Complaint  Chief Complaint: COVID+ 01/12/19. c/o SOB and social anxiety related to stressors at home (01/19/19 14:50:00).      History of Present Illness   43 year old male with past medical history of anxiety presents to emergency room due to shortness of breath.  Patient tested positive for COVID-19 on 01/12/2019.  Since then he has had mild symptoms including dry cough, nausea, diarrhea, and body aches.  This past week he start developing shortness of breath that is worse with exertion, and relieved at rest.  He is extremely anxious about possible cardiac or pulmonary conditions that are happening.  He does report that he had ENT surgery 3 weeks ago due to a deviated septum.  This procedure went without complication.  He is concerned that the surgery could also be causing his shortness of breath, he states that he needs to find out what is going on.  Patient denies any fever, chills, chest pain, abdominal pain, vomiting, dysuria, headache, dizziness.  .        Review of Systems   Constitutional symptoms:  No fever, no chills.    Skin symptoms:  No rash,    Eye symptoms:  Vision unchanged.   ENMT symptoms:  No sore throat,    Respiratory symptoms:  Shortness of breath, cough, no sputum production, no wheezing.    Cardiovascular symptoms:  No chest pain,    Gastrointestinal symptoms:  Nausea, diarrhea, no abdominal pain, no vomiting.    Genitourinary symptoms:  No dysuria,    Musculoskeletal symptoms:  Muscle pain.   Neurologic symptoms:  No headache,               Additional review of systems information: All other systems reviewed and otherwise negative.      Health Status   Allergies:    Allergic Reactions (All)  No Known Medication Allergies.      Past Medical/ Family/ Social History   Medical history:    No active or resolved past medical history items have been selected or recorded., Reviewed as documented in chart.   Surgical history:    No active procedure history items have been selected or recorded., Reviewed as documented in chart.   Family history: Reviewed as documented in chart.   Social history:    Social & Psychosocial Habits    Alcohol  01/19/2019  Use: Denies    Substance Use  01/19/2019  Use: Denies    Tobacco  01/19/2019  Use: Former smoker, quit more    Comment: pt uses nicotine  pouches - 01/19/2019 15:26 - Mar, RN, Aleck  , Reviewed as documented in chart.   Problem list:    Active Problems (3)  Anxiety  COVID-19   Deviated septum   .      Physical Examination               Vital Signs   Vital Signs   01/19/2019 15:37 EST Systolic Blood Pressure 104 mmHg    Diastolic Blood Pressure 82 mmHg    Peripheral Pulse Rate 90 bpm    Heart Rate Monitored 89 bpm    Respiratory Rate 12 br/min    Mean Arterial Pressure, Cuff 88 mmHg    SpO2 93 %   01/19/2019 14:50 EST Systolic Blood Pressure 133 mmHg    Diastolic Blood Pressure 86 mmHg    Temperature Oral 36.7 degC    Heart Rate Monitored 96 bpm    Respiratory Rate 20 br/min    SpO2 99 %   .   Measurements   01/19/2019 14:52 EST Body Mass Index est meas 31.80 kg/m2    Body Mass Index Measured 31.80 kg/m2   01/19/2019 14:50 EST Height/Length Measured 167 cm    Weight Dosing 88.7 kg   .   Basic Oxygen Information   01/19/2019 15:37 EST SpO2 93 %   01/19/2019 14:50 EST Oxygen Therapy Room air    SpO2 99 %   .   General:  Alert, no acute distress, anxious.    Skin:  Warm, dry, pink.    Head:  Normocephalic.   Neck:  Supple, trachea midline.    Eye:  Pupils are equal, round and reactive to light.   Ears, nose,  mouth and throat:  Oral mucosa moist, no pharyngeal erythema or exudate.    Cardiovascular:  Regular rate and rhythm.   Respiratory:  Lungs are clear to auscultation, respirations are non-labored, breath sounds are equal, Symmetrical chest wall expansion.    Back:  Normal range of motion.   Musculoskeletal:  Normal ROM, no tenderness.    Chest wall   Gastrointestinal:  Soft, Nontender, Non distended.    Neurological:  Alert and oriented to person, place, time, and situation, No focal neurological deficit observed, CN II-XII intact, normal sensory observed, normal motor observed.    Lymphatics:  No lymphadenopathy.   Psychiatric:  Cooperative, appropriate mood & affect.       Medical Decision Making   Rationale:  PA/NP reviewed with co-signing physician: ECG, lab results, radiology studies, medication, diagnosis, and plan of care.   Documents reviewed:  Emergency department nurses' notes, flowsheet, emergency department records, prior records, vital signs, vital signs.    Electrocardiogram:  Time 01/19/2019 15:22:00, rate 93, normal sinus rhythm, No ST-T changes, no ectopy, normal PR & QRS intervals, EKG interpreted by me and attending ER physician.SABRA    Results review:  Lab results : Lab View   01/19/2019 16:43 EST Estimated Creatinine Clearance 98.90 mL/min   01/19/2019 16:09 EST WBC 8.2 x10e3/mcL    RBC 5.22 x10e6/mcL    Hgb 15.3 g/dL    HCT 55.7 %    MCV 15.2 fL    MCH 29.3 pg    MCHC 34.6 g/dL    RDW 87.6 %    Platelet 189 x10e3/mcL    MPV 9.3 fL    Neutro Auto 75.9 %  HI    Neutro Absolute 6.2 x10e3/mcL    Immature Grans Percent 0.4 %    Immature Grans Absolute 0.03 x10e3/mcL    Lymph Auto 14.4 %  LOW    Lymph Absolute 1.2 x10e3/mcL    Mono Auto 8.8 %    Mono Absolute 0.7  x10e3/mcL    Eosinophil Percent 0.4 %    Eos Absolute 0.0 x10e3/mcL    Basophil Auto 0.1 %    Baso Absolute 0.0 x10e3/mcL    D Dimer <=0.27 mcg/mL FEU    Sodium Lvl 134 mmol/L  LOW    Potassium Lvl 5.0 mmol/L    Chloride 98 mmol/L    CO2 24  mmol/L    Glucose Random 97 mg/dL    BUN 12 mg/dL    Creatinine Lvl 1.0 mg/dL    AGAP 12 mmol/L    Osmolality Calc 268 mOsm/kg  LOW    Calcium  Lvl 9.0 mg/dL    eGFR AA 893 fO/fpw/8.26f    eGFR Non-AA 92 mL/min/1.39m    Trop T Quant <0.010 ng/mL    Trop 5th Gen Study <6.0 ng/L    NTproBNP <5 pg/mL   01/19/2019 14:52 EST Estimated Creatinine Clearance 98.90 mL/min   , Pulse Oximetry is greater than 94% on room air which is acceptable per my interpretation.    Radiology results:  Rad Results (ST)   XR Chest 1 View Portable  ?  01/19/19 15:29:59  Chest AP: 01/19/19    INDICATION:Shortness of breath (SOB).    COMPARISON: None    FINDINGS:    Diffuse peribronchial cuffing and interstitial prominence. No effusion or  pneumothorax.    Cardiomediastinal silhouette within normal limits.    No acute bony abnormality.    IMPRESSION:  Peribronchial cuffing and interstitial prominence, which may reflect viral  infection.  ?  Signed By: GILES DORISE LEVER  .   Notes:  Patient was evaluated emergency department for symptoms described in history of present illness.  Patient is well-appearing, oxygenating well on room air, lungs are clear, able to speak in full sentences.  History and physical exam are consistent with COVID-19 infection.  Due to complaint of shortness of breath, and multiple other complaints a work-up was initiated.  Lab work was reassuring.  D-dimer was negative.  Chest x-ray shows evidence of viral infection.  EKG shows no evidence of acute ischemia or infarct.  Patient is not a candidate for admission at this time.  I spoken to patient about signs and symptoms for which return to emergency department.  Patient will benefit from supportive measures, including increased fluid intake, pain control with Motrin or Tylenol, mucinex-DM. Patient continues to rest comfortably in exam room, smiling and nontoxic appearing.  Remains hemodynamically stable.  No evidence of distress or discomfort. Safe for discharge  home.  He/she was evaluated in the context of a global COVID 19 pandemic, which necessitated consideration that the patient might be at risk for the SARS-CoV-2 virus that causes COVID-19.  Institutional protocols and algorithms that pertain to the evaluation patient is at risk for COVID-19 are in a state of rapid change based on information release by regulatory bodies including the CDC and federal and state organizations.  These policies and algorithms were followed during the patient's care in the ED.   SABRA      Reexamination/ Reevaluation   Time: 01/19/2019 17:00:00 .   Vital signs   Basic Oxygen Information   01/19/2019 15:37 EST SpO2 93 %   01/19/2019 14:50 EST Oxygen Therapy Room air    SpO2 99 %      Course: improving.   Pain status: decreased.   Notes: Patient's physical exam, oxygen saturation and chest x-ray findings all do not indicate need for inpatient care at present time.  Patient was given precautions both  verbal and written and recommendation for home quarantine..      Impression and Plan   Diagnosis   Dyspnea (ICD10-CM R06.00, Discharge, Medical)   COVID-19 (ICD10-CM U07.1, Discharge, Medical)   Nausea (ICD10-CM R11.0, Discharge, Medical)   Anxiety (ICD10-CM F41.9, Discharge, Medical)   Plan   Condition: Improved, Stable.    Disposition: Discharged: Time  01/19/2019 17:10:00, to home.    Prescriptions: Launch prescriptions   Pharmacy:  Zofran 4 mg oral tablet (Prescribe): 4 mg, 1 tabs, Oral, q4hr, for 3 days, PRN: nausea, 12 tabs, 0 Refill(s).    Patient was given the following educational materials: Nausea, Adult, Easy-to-Read, Generalized Anxiety Disorder, Shortness of Breath, Easy-to-Read,  COVID Discharge Instructions (CUSTOM).    Follow up with: Follow up with primary care provider Within 1 week Continue to quarantine yourself to ensure that you do not infect others.   If you develop any new or changing symptoms including fevers, shortness of breath, other new or changing symptoms, return to the  emergency department for evaluation.    Take Zofran for nausea relief  I recommend supportive measures, including increased fluid intake, pain control with Motrin or Tylenol, mucinex-DM.SABRA    Counseled: Patient.    Notes: I had a detailed discussion with the patient and/or guardian regarding the historical points/exam findings and test results supporting the discharge diagnosis. Also discussed need for outpatient followup for continue management and reevaluation of current symptoms. Discussed the need to return to the ER if symptoms persist/worsen, or for any questions/concerns that arise at home.    Signature Line     Electronically Signed on 01/19/2019 05:16 PM EST   ________________________________________________   REENA MORT C      Electronically Signed on 01/19/2019 05:17 PM EST   ________________________________________________   LINE-MD,  CHRISTOPHER C            Modified by: REENA MORT C on 01/19/2019 05:16 PM EST

## 2019-01-19 NOTE — ED Notes (Signed)
ED Triage Note       ED Secondary Triage Entered On:  01/19/2019 15:27 EST    Performed On:  01/19/2019 15:24 EST by Kennith Center, RN, Lilyan Punt Information   Barriers to Learning :   None evident   Pt. Currently Receiving Radiation :   No   ED Home Meds Section :   Document assessment   [facility] ED Fall Risk Section :   Document assessment   ED History Section :   Document assessment   ED Advance Directives Section :   Document assessment   ED Palliative Screen :   N/A (prefilled for <65yo)   Kennith Center RNChrys Racer - 01/19/2019 15:24 EST   (As Of: 01/19/2019 15:27:09 EST)   Problems(Active)    Anxiety (SNOMED CT  :16109604 )  Name of Problem:   Anxiety ; Recorder:   Belenda Cruise, RN, Ross Ludwig; Confirmation:   Confirmed ; Classification:   Patient Stated ; Code:   [id number] ; Contributor System:   PowerChart ; Last Updated:   01/19/2019 14:51 EST ; Life Cycle Date:   01/19/2019 ; Life Cycle Status:   Active ; Vocabulary:   SNOMED CT        COVID-19 (SNOMED CT  :1478295621 )  Name of Problem:   [id number] ; Recorder:   Belenda Cruise, RN, Ross Ludwig; Confirmation:   Confirmed ; Classification:   Patient Stated ; Code:   [id number] ; Contributor System:   Conservation officer, nature ; Last Updated:   01/19/2019 14:51 EST ; Life Cycle Date:   01/19/2019 ; Life Cycle Status:   Active ; Vocabulary:   SNOMED CT        Deviated septum (SNOMED CT  :132440102 )  Name of Problem:   Deviated septum ; Recorder:   Belenda Cruise, RN, Ross Ludwig; Confirmation:   Confirmed ; Classification:   Patient Stated ; Code:   [id number] ; Contributor System:   PowerChart ; Last Updated:   01/19/2019 14:51 EST ; Life Cycle Date:   01/19/2019 ; Life Cycle Status:   Active ; Vocabulary:   SNOMED CT          Diagnoses(Active)    Bloating  Date:   01/19/2019 ; Diagnosis Type:   Reason For Visit ; Confirmation:   Complaint of ; Clinical Dx:   Bloating ; Classification:   Medical ; Clinical Service:   Non-Specified ; Code:   PNED ; Probability:   0 ; Diagnosis Code:    [id number]      Shortness of breath  Date:   01/19/2019 ; Diagnosis Type:   Reason For Visit ; Confirmation:   Complaint of ; Clinical Dx:   Shortness of breath ; Classification:   Medical ; Clinical Service:   Non-Specified ; Code:   PNED ; Probability:   0 ; Diagnosis Code:   [id number]             -    Procedure History   (As Of: 01/19/2019 15:27:09 EST)     Lennette Bihari Fall Risk Assessment Tool   Hx of falling last 3 months ED Fall :   No   Kennith Center RNChrys Racer - 01/19/2019 15:24 EST   ED Advance Directive   Advance Directive :   No   Kennith Center RNChrys Racer - 01/19/2019 15:24 EST   Social History   Social History   (As Of: 01/19/2019 15:27:09 EST)   Tobacco:  Tobacco use: Former smoker, quit more than 30 days ago.   Comments:  01/19/2019 15:26 - Kennith Center, RN, Chrys Racer: pt uses nicotine pouches   (Last Updated: 01/19/2019 15:26:39 EST by Kennith Center, RN, Chrys Racer)          Alcohol:        Denies   (Last Updated: 01/19/2019 15:26:43 EST by Kennith Center, RN, Chrys Racer)          Substance Use:        Denies   (Last Updated: 01/19/2019 15:26:47 EST by Kennith Center, RN, Chrys Racer)            Med Hx   Medication List   (As Of: 01/19/2019 15:27:09 EST)   Home Meds    levocetirizine  :   levocetirizine ; Status:   Documented ; Ordered As Mnemonic:   levocetirizine 5 mg oral tablet ; Simple Display Line:   0 Refill(s) ; Catalog Code:   levocetirizine ; Order Dt/Tm:   01/19/2019 15:26:00 EST          levothyroxine  :   levothyroxine ; Status:   Documented ; Ordered As Mnemonic:   Synthroid 112 mcg (0.112 mg) oral tablet ; Simple Display Line:   112 mcg, 1 tabs, Oral, Daily, 0 Refill(s) ; Catalog Code:   levothyroxine ; Order Dt/Tm:   01/19/2019 15:26:00 EST          tiZANidine  :   tiZANidine ; Status:   Documented ; Ordered As Mnemonic:   tiZANidine 2 mg oral tablet ; Simple Display Line:   0 Refill(s) ; Catalog Code:   tiZANidine ; Order Dt/Tm:   01/19/2019 15:26:00 EST

## 2019-01-19 NOTE — ED Notes (Signed)
ED Triage Note       ED Triage Adult Entered On:  01/19/2019 14:52 EST    Performed On:  01/19/2019 14:50 EST by Belenda Cruise, RN, Ross Ludwig               Triage   Chief Complaint :   COVID+ 01/12/19. c/o SOB and social anxiety related to stressors at home   Numeric Rating Pain Scale :   0 = No pain   [place] Mode of Arrival :   Walking   Infectious Disease Documentation :   Document assessment   Patient received chemo or biotherapy last 48 hrs? :   No   Temperature Oral :   36.7 degC(Converted to: 98.1 degF)    Heart Rate Monitored :   96 bpm   Respiratory Rate :   20 br/min   Systolic Blood Pressure :   660 mmHg   Diastolic Blood Pressure :   86 mmHg   SpO2 :   99 %   Oxygen Therapy :   Room air   Patient presentation :   None of the above   Chief Complaint or Presentation suggest infection :   No   Dosing Weight Obtained By :   Measured   Weight Dosing :   88.7 kg(Converted to: 195 lb 9 oz)    Height :   167 cm(Converted to: 5 ft 6 in)    Body Mass Index Dosing :   32 kg/m2   Burnett Kanaris - 01/19/2019 14:50 EST   DCP GENERIC CODE   Tracking Acuity :   3   Tracking Group :   ED [employer]   Belenda Cruise, RN, Ross Ludwig - 01/19/2019 14:50 EST   ED General Section :   Document assessment   Pregnancy Status :   N/A   ED Allergies Section :   Document assessment   ED Reason for Visit Section :   Document assessment   ED Quick Assessment :   Patient appears awake, alert, oriented to baseline. Skin warm and dry. Moves all extremities. Respiration even and unlabored. Appears in no apparent distress.   Belenda Cruise, RN, Torrie Mayers L - 01/19/2019 14:50 EST   ID Risk Screen Symptoms   Recent Travel History :   No recent travel   Close Contact with COVID-19 ID :   No   Last 14 days COVID-19 ID :   Yes - Detected (positive)   TB Symptom Screen :   No symptoms   C. diff Symptom/History ID :   Neither of the above   Belenda Cruise, RN, [place] L - 01/19/2019 14:50 EST   Allergies   (As Of: 01/19/2019 14:52:46 EST)   Allergies  (Active)   No Known Medication Allergies  Estimated Onset Date:   Unspecified ; Created By:   Belenda Cruise, RN, Torrie Mayers L; Reaction Status:   Active ; Category:   Drug ; Substance:   No Known Medication Allergies ; Type:   Allergy ; Updated By:   Belenda Cruise RN, Ross Ludwig; Reviewed Date:   01/19/2019 14:51 EST        Psycho-Social   Last 3 mo, thoughts killing self/others :   Patient denies   Burnett Kanaris - 01/19/2019 14:50 EST   ED Reason for Visit   (As Of: 01/19/2019 14:52:46 EST)   Problems(Active)    Anxiety (SNOMED CT  :63016010 )  Name of Problem:   Anxiety ; Recorder:   Belenda Cruise,  RN, Ross Ludwig; Confirmation:   Confirmed ; Classification:   Patient Stated ; Code:   [id number] ; Contributor System:   PowerChart ; Last Updated:   01/19/2019 14:51 EST ; Life Cycle Date:   01/19/2019 ; Life Cycle Status:   Active ; Vocabulary:   SNOMED CT        COVID-19 (SNOMED CT  :3875643329 )  Name of Problem:   [id number] ; Recorder:   Belenda Cruise, RN, Ross Ludwig; Confirmation:   Confirmed ; Classification:   Patient Stated ; Code:   [id number] ; Contributor System:   Conservation officer, nature ; Last Updated:   01/19/2019 14:51 EST ; Life Cycle Date:   01/19/2019 ; Life Cycle Status:   Active ; Vocabulary:   SNOMED CT        Deviated septum (SNOMED CT  :[id number] )  Name of Problem:   Deviated septum ; Recorder:   Belenda Cruise, RN, Ross Ludwig; Confirmation:   Confirmed ; Classification:   Patient Stated ; Code:   [id number] ; Contributor System:   PowerChart ; Last Updated:   01/19/2019 14:51 EST ; Life Cycle Date:   01/19/2019 ; Life Cycle Status:   Active ; Vocabulary:   SNOMED CT          Diagnoses(Active)    Bloating  Date:   01/19/2019 ; Diagnosis Type:   Reason For Visit ; Confirmation:   Complaint of ; Clinical Dx:   Bloating ; Classification:   Medical ; Clinical Service:   Non-Specified ; Code:   PNED ; Probability:   0 ; Diagnosis Code:   [id number]      Shortness of breath  Date:   01/19/2019 ; Diagnosis Type:    Reason For Visit ; Confirmation:   Complaint of ; Clinical Dx:   Shortness of breath ; Classification:   Medical ; Clinical Service:   Non-Specified ; Code:   PNED ; Probability:   0 ; Diagnosis Code:   [id number]

## 2019-01-19 NOTE — ED Notes (Signed)
 ED Patient Education Note     ;Patient Education Materials Follows:                     COVID-19 Testing, Precautions, & Infection  q Your COVID-19 lab test result is PENDING.   Your COVID-19 lab result is not available at this time. Please allow up to 7 days for processing and continue to self-isolate at home for the next 10 days and no fever last 24 hours of isolation or unless you have a confirmed negative result (not detected). If your result is positive (detected) you will receive a phone call from [facility]. [employer]. Please follow all other discharge instructions given to you by your healthcare provider.    q Your COVID-19 lab test result is POSITIVE.    Your COVID-19 lab result is positive (detected) for the COVID-19 infection. Please follow all other discharge instructions given to you by your healthcare provider.  You must self-isolate at home for 10 days and no fever last 24 hours of isolation starting the day that you first began to feel sick. Minimize contact with others, including people that live in the same house.    q Your COVID-19 lab test result is NEGATIVE.   Your COVID-19 lab result is negative (not detected) for the COVID-19 infection. Please follow all other discharge instructions given to you by your healthcare provider    q Your COVID-19 antibody lab test is REACTIVE.   Your COVID-19 antibody lab result is reactive to the SARS-CoV-2 antibodies, the virus that causes the COVID-19 infection. A reactive result to the antibody test means that you have most likely had a previous COVID-19 infection but, DOES NOT mean that you have immunity from a future COVID-19 infection. Please follow all other discharge instructions given to you by your healthcare provider. If you have any additional questions about your reactive antibody result call your 'Primary Care Physician'.   q Your COVID-19 antibody lab test is NON-REACTIVE.   Your COVID-19 antibody lab result is non-reactive to the SARS-CoV-2  antibodies, the virus that causes the COVID-19 infection. A non-reactive result to the antibody test means that you HAVE NOT   had a previous COVID-19 infection. Please follow all other discharge instructions given to you by your healthcare provider. If you have any additional questions about your non-reactive antibody result call your 'Primary Care Physician'.  **** The quickest way to view or print your own test result is by the 'Patient Portal' at [email or web address] see next page for further 'Patient Portal' details.  Note: In reviewing your COVID-19 result on the patient portal (under the 'results' tab) you may notice a variation in the test name that includes, "Coronavirus", "cNoV", "Corona", "SARS", or "COV2". These all relate to a COVID-19 test that you had performed at a [facility]. [employer] location.  How to Access the [facility] Patient Portal  1. Go to: [email or web address]  2. You want the option of:  [facility]   If you do not have an account and are visiting the portal for the first time, select "Sign up now".   If you already have an account, select "Log into [facility]".       3. If you are signing up for a new account you will fill out a "Self-Enrollment for [facility]" page in which you will securely enter your first, last name, date of birth, social security number, and an identity verification prompt. *Tip:  when filling out the "Self-Enrollment  for [facility]" enter your name using the same spelling that is on file with your physician's office or hospital. Once this page is filled out, hit the purple "Next" button at the bottom of the page.   4. Verify your information and click the purple "Next Create Your Account" button.   5. After identity verification, you will create your username and password for your "[facility]" Patient Portal. You will then click the green "Create Account" button.   6. Once your account has been created, you  will be taken back to a login screen. Log in with the username and password that you created in step 5.     ! If you need additional assistance with the [facility] Patient Portal, you may call the [facility] at [phone].      What does self-isolate mean?   Avoid leaving the house unless seeking healthcare treatment. Things that you should NOT do include:  work, school, church, restaurants, and social gatherings   Do not share utensils, drinking glasses, towels, or bedding with other people   Wash/sanitize your hands frequently and avoid touching your face.   Frequently clean "high-touch" surfaces (e.g. counter tops, doorknobs, bathroom fixtures, phones, tablets, and keyboards).   Try to remain 6 feet away from others as much as possible.   Avoid sharing confined spaces with others as much as possible.  If you live in your home with other people, consider keeping a separate bedroom and bathroom just for your use.  What If My Symptoms Get Worse?  Follow-up with your doctor or return to the ER for any difficulty breathing or other worsening symptoms.  You may also see a doctor from home by going to [email or web address]      CDC COVID-19 Resources   For more information, visit.  [email or web address]       COVID-Related Education from the CDC  (Scan these codes with an Apple or Android device)           Prevent the spread of    What you should know  COVID-19 if you are sick:  about COVID-19 to        protect yourself and        others:                                                         COVID-19: Quarantine       10 things you can do to  vs. Isolation:         manage your COVID-19             symptoms at home:                                     What is COVID-19?  COVID-19 stands for coronavirus disease 2019. It is caused by a virus called SARS-CoV-2. The virus first appeared in late 2019 and quickly spread around the world.  People with  COVID-19 can have fever, cough, trouble breathing, and other symptoms. Problems with breathing happen when the infection affects the lungs and causes pneumonia.  Most people who get COVID-19 will not get severely ill. But some do. In many areas, people have been told  to stay home and away from other people. This is to try to slow the spread of the virus.  How is COVID-19 Spread?    The virus that causes COVID-19 mainly spreads from person to person. This usually happens when an infected person coughs, sneezes, or talks near other people. The virus can be passed easily between people who live together. But it can also spread at gatherings where people are talking close together, shaking hands, hugging, sharing food, or even singing together. Doctors also think it is possible to get sick if you touch a surface that has the virus on it and then touch your mouth, nose, or eyes.  A person can be infected, and spread the virus to others, even without having any symptoms. This is why keeping people apart is one of the best ways to slow the spread.  It is also possible for the virus to spread from an infected person to an animal, like a pet. But this seems to be uncommon. There is no evidence that a person could get the virus from a pet.  Experts do not think the virus is spread through food like some other viruses. There is also no evidence that it can be spread through the water in a pool or hot tub. But because the virus can spread when people are close together, things like swimming in a crowded pool are still risky.  What are the Symptoms of COVID-19?    Symptoms usually start 4 or 5 days after a person is infected with the virus. But in some people, it can take up to 2 weeks for symptoms to appear. Some people never show symptoms at all.  When symptoms do happen, they can include:    Fever   Cough   Trouble breathing   Feeling tired   Shaking chills   Muscle aches   Headache   Sore throat   Problems with  sense of smell or taste         Some people have digestive problems like nausea or diarrhea. There have also been some reports of rashes or other skin symptoms. For example, some people with COVID-19 get reddish-purple spots on their fingers or toes. But it's not clear why or how often this happens.  For most people, symptoms will get better within a few weeks. But in others, COVID-19 can lead to serious problems like pneumonia, not getting enough oxygen, heart problems, or even death. This risk gets higher as people get older. It is also higher in people who have other health problems like serious heart disease, chronic kidney disease, type 2 diabetes, chronic obstructive pulmonary disease (COPD), sickle cell disease, or obesity. People who have a weak immune system for other reasons (for example, HIV infection or certain medicines), asthma, cystic fibrosis, type 1 diabetes, or high blood pressure might also be at higher risk for serious problems.    What should I do if I have symptoms?  If you have a fever, cough, trouble breathing, or other symptoms of COVID-19, call your doctor or nurse. They will ask about your symptoms. They might also ask about any recent travel and whether you have been around anyone who might be sick.  If your symptoms are not severe, it is best to call before you go in. The staff can tell you what to do and whether you need to be seen in person. Many people with only mild symptoms should stay home and avoid other people until they get  better. If you do need to go to the clinic or hospital, cover your nose and mouth with cloth. This helps protect other people. The staff might also have you wait someplace away from other people.  If you are severely ill and need to go to the clinic or hospital right away, you should still call ahead if possible. This way the staff can care for you while taking steps to protect others. If you think you are having a medical emergency, dial 9-1-1.  Is there a  test for the virus that causes COVID-19?  Yes. If your doctor or nurse suspects you have COVID-19, they might take a swab from inside your nose for testing. If you are coughing up mucus, they might also test a sample of the mucus. These tests can help your doctor figure out if you have COVID-19 or another illness.  In some areas, it might not be possible to test everyone who might have been exposed to the virus. If your doctor cannot test you, they might tell you to stay home, avoid other people, and call if your symptoms get worse.  There is also a blood test that can show if a person has had COVID-19 in the past. This is called an antibody test. Over time, this could help experts understand how many people were infected without knowing it. Experts are also using blood tests to study whether a person who has had COVID-19 could get it again.  How is COVID-19 treated?  Many people will be able to stay home while they get better. But people with serious symptoms or other health problems might need to go to the hospital.  Mild illness - Mild illness means you might have symptoms like fever and cough, but you do not have trouble breathing. Most people with COVID-19 have mild illness and can rest at home until they get better. This usually takes about 2 weeks, but it's not the same for everyone.  If you are recovering from COVID-19, it's important to stay home and self-isolate until your doctor or nurse tells you it's safe to go back to your normal activities. Self-isolation means staying apart from other people, even the people you live with. When you can stop self-isolation will depend on how long it has been since you had symptoms, and in some cases, whether you have had a negative test (showing that the virus is no longer in your body).  Severe illness - If you have more severe illness with trouble breathing, you might need to stay in the hospital, possibly in the intensive care unit (also called the ICU).  While you are there, you will most likely be in a special isolation room. Only medical staff will be allowed in the room, and they will have to wear special gowns, gloves, masks, and eye protection.The doctors and nurses can monitor and support your breathing and other body functions and make you as comfortable as possible. You might need extra oxygen to help you breathe easily. If you are having a very hard time breathing, you might need to be put on a ventilator. This is a machine to help you breathe.  Doctors are studying several possible treatments for COVID-19. In certain cases, doctors might recommend medicines that seem to help some people who are severely ill. They also might recommend being part of a clinical trial. A clinical trial is a scientific study that tests new medicines to see how well they work. Do not try any new medicines  or treatments without talking to a doctor.  Can COVID-19 be prevented?  There is not yet a vaccine to prevent COVID-19. But there are things you can do to help slow the spread. These steps are a good idea for everyone, especially in areas where the infection has spread very quickly. But they are extra important for people who are older or who have other health problems.  To help protect yourself and others:  Practice social distancing. It's most important to avoid contact with people who are sick. But social distancing also means staying away from all people who do not live in your household. It is sometimes called physical distancing.  Avoiding crowds is an important part of social distancing. But even small gatherings can be risky, so it's best to stay home as much as you can. When you do need to go out, try your best to stay at least 6 feet (about 2 meters) away from other people.  Wear a cloth face mask when you need to go out. Experts in many countries recommend doing this. It is mostly so that if you are sick, even if you don't have any symptoms, you are less likely  to spread the infection to other people. You can use a cloth or homemade mask to cover your mouth and nose. In most cases, experts recommend leaving medical masks for health workers.  When you take your cloth mask off, make sure you do not touch your eyes, nose, or mouth. And wash your hands after you touch the mask. You can wash the cloth mask with the rest of your laundry.  Wash your hands with soap and water often and Avoid touching your face, especially your mouth, nose, and eyes. This is especially important after being out in public, getting your mail, or touching packages or other deliveries. The risk of getting infected by touching items like this is not well known, but is probably not very high. Still, it's a good idea to wash your hands often.    Make sure to rub your hands with soap for at least 20 seconds, cleaning your wrists, fingernails, and in between your fingers. Then rinse your hands and dry them with a paper towel you can throw away. If you are not near a sink, you can use a hand sanitizing gel to clean your hands. The gels with at least 60 percent alcohol work the best. But it is better to wash with soap and water if you can.  Avoid traveling if you can. Some experts recommend not traveling to or from certain areas where there are a lot of cases of COVID-19. But any form of travel, especially if you spend time in crowded places like airports, increases your risk. If lots of people travel, it also makes it more likely that the virus will spread to more parts of the world.    Why is social distancing so important?  Keeping people away from each other is one of the best ways to control the spread of the virus that causes COVID-19. That's because the virus can spread easily through close contact, and it's not always possible to know who is infected.  Different areas have different rules. In many places, schools and businesses are closed, and events have been canceled or postponed. But social  distancing is not just about avoiding big crowds. The safest thing to do is to avoid any gatherings with people from outside your household, even in small groups. Many people find it helpful  to stay in touch with friends and relatives in other ways, like over the phone or online. If you have outdoor space, or can take a walk without getting near other people, it can also help to get fresh air when you are able.  When experts recommend staying home, it's important to take this seriously and follow instructions as best you can. If you do need to be around other people, keep in mind that:   The virus can spread both indoors and outdoors. But being outdoors is probably less risky.   The more people you come into contact with, and the more often you do this, the higher the risk of spreading the virus.   Washing your hands often, staying 6 feet (2 meters) away from people, and wearing a cloth mask will all help lower the risk to you and others.  It's hard having to change your life and habits, and it's normal to want things to get back to the way they used to be. But remember, even if you do not get very sick from COVID-19, you could still spread it to others who could get very sick. If people stop social distancing too soon, more people will get sick.  What should I do if someone in my home has COVID-19?  If someone in your home has COVID-19, there are additional things you can do to protect yourself and others:   Keep the sick person away from others - The sick person should stay in a separate room, and use a different bathroom if possible. They should also eat in their own room.   Experts also recommend that the person stay away from pets in the house until they are better.   Have them cover their face - The sick person should cover their nose and mouth with a cloth mask when they are in the same room as other people. If they can't use a face cover, you can help protect yourself by covering your face when you are  in the room with them.   Wash hands [employer] your hands with soap and water often (see above).   Clean often - Here are some specific things that can help:  Wear disposable gloves when you clean. It's also a good idea to wear gloves when you have to touch the sick person's laundry, dishes, utensils, or trash.  When you do the sick person's laundry, avoid letting dirty clothes or bedding touch your body. Wash your hands and clean the outside of the washer after putting in the laundry.  Regularly clean things that are touched a lot. This includes counters, bedside tables, doorknobs, computers, phones, and bathroom surfaces.  Clean things in your home with soap and water, but also use disinfectants on appropriate surfaces. Some cleaning products work well to kill bacteria, but not viruses, so it's important to check labels.                    Allergy     Shortness of Breath    Shortness of breath means you have trouble breathing. Shortness of breath needs medical care right away.      HOME CARE     Do not smoke.      Avoid being around chemicals or things (paint fumes, dust) that may bother your breathing.      Rest as needed. Slowly begin your normal activities.     Only take medicines as told by your doctor.  Keep all doctor visits as told.    GET HELP RIGHT AWAY IF:     Your shortness of breath gets worse.     You feel lightheaded, pass out (faint), or have a cough that is not helped by medicine.     You cough up blood.     You have pain with breathing.     You have pain in your chest, arms, shoulders, or belly (abdomen).     You have a fever.     You cannot walk up stairs or exercise the way you normally do.     You do not get better in the time expected.     You have a hard time doing normal activities even with rest.     You have problems with your medicines.     You have any new symptoms.    MAKE SURE YOU:     Understand these instructions.     Will watch your condition.     Will get help right away if you  are not doing well or get worse.    This information is not intended to replace advice given to you by your health care provider. Make sure you discuss any questions you have with your health care provider.    Document Released: 07/02/2007 Document Revised: 01/18/2013 Document Reviewed: 03/31/2011  Elsevier Interactive Patient Education ?2016 [employer]      Behavioral Health     Generalized Anxiety Disorder    Generalized anxiety disorder (GAD) is a mental disorder. It interferes with life functions, including relationships, work, and school.    GAD is different from normal anxiety, which everyone experiences at some point in their lives in response to specific life events and activities. Normal anxiety actually helps us  prepare for and get through these life events and activities. Normal anxiety goes away after the event or activity is over.     GAD causes anxiety that is not necessarily related to specific events or activities. It also causes excess anxiety in proportion to specific events or activities. The anxiety associated with GAD is also difficult to control. GAD can vary from mild to severe. People with severe GAD can have intense waves of anxiety with physical symptoms (panic attacks).     SYMPTOMS    The anxiety and worry associated with GAD are difficult to control. This anxiety and worry are related to many life events and activities and also occur more days than not for 6 months or longer. People with GAD also have three or more of the following symptoms (one or more in children):     Restlessness. ?     Fatigue.     Difficulty concentrating. ?     Irritability.     Muscle tension.     Difficulty sleeping or unsatisfying sleep.    DIAGNOSIS    GAD is diagnosed through an assessment by your health care provider. Your health care provider will ask you questions about?your mood,?physical symptoms, and events in your life. Your health care provider may ask you about your medical history and use of  alcohol or drugs, including prescription medicines. Your health care provider may also do a physical exam and blood tests. Certain medical conditions and the use of certain substances can cause symptoms similar to those associated with GAD. Your health care provider may refer you to a mental health specialist for further evaluation.    TREATMENT    The following therapies are usually used to  treat GAD:      Medication. Antidepressant medication usually is prescribed for long-term daily control. Antianxiety medicines may be added in severe cases, especially when panic attacks occur. ?     Talk therapy (psychotherapy). Certain types of talk therapy can be helpful in treating GAD by providing support, education, and guidance. A form of talk therapy called cognitive behavioral therapy can teach you healthy ways to think about and react to daily life events and activities.      Stress management?techniques. These include yoga, meditation, and exercise and can be very helpful when they are practiced regularly.    A mental health specialist can help determine which treatment is best for you. Some people see improvement with one therapy. However, other people require a combination of therapies.    This information is not intended to replace advice given to you by your health care provider. Make sure you discuss any questions you have with your health care provider.    Document Released: 05/10/2012 Document Revised: 02/03/2014 Document Reviewed: 05/10/2012  Elsevier Interactive Patient Education ?2016 [employer] Inc[employer]      Obstetrics and Gynecology     Nausea, Adult    Nausea means you feel sick to your stomach or need to throw up (vomit). It may be a sign of a more serious problem. If nausea gets worse, you may throw up. If you throw up a lot, you may lose too much body fluid (dehydration).      HOME CARE     Get plenty of rest.     Ask your doctor how to replace body fluid losses (rehydrate).     Eat small amounts of food.  Sip liquids more often.     Take all medicines as told by your doctor.    GET HELP RIGHT AWAY IF:     You have a fever.     You pass out (faint).     You keep throwing up or have blood in your throw up.     You are very weak, have dry lips or a dry mouth, or you are very thirsty (dehydrated).     You have dark or bloody poop (stool).     You have very bad chest or belly (abdominal) pain.     You do not get better after 2 days, or you get worse.     You have a headache.    MAKE SURE YOU:     Understand these instructions.     Will watch your condition.     Will get help right away if you are not doing well or get worse.    This information is not intended to replace advice given to you by your health care provider. Make sure you discuss any questions you have with your health care provider.    Document Released: 01/02/2011 Document Revised: 04/07/2011 Document Reviewed: 09/19/2014  Elsevier Interactive Patient Education ?2016 Elsevier Inc[employer]

## 2019-01-19 NOTE — Discharge Summary (Signed)
 ED Clinical Summary                         [facility]  [address]  [place], [place] [address]  [phone]           PERSON INFORMATION  Name: Jacob Yu, Jacob Yu Age:  42 Years DOB: November 06, 1975   Sex: Male Language: English PCP: PCP,  NONE   Marital Status:  Single Phone: [phone] Med Service: MED-Medicine   MRN:  [id number] Acct# [id number] Arrival: 01/19/2019 14:38:00   Visit Reason: Bloating; Shortness of breath; COVID + / WEAK/D/F/N/V Acuity: 3 LOS: 000 02:58   Address:      [address] [place] [address]  Diagnosis:      Anxiety; COVID-19; Dyspnea; Nausea  Printed Prescriptions:            Allergies      No Known Medication Allergies      Medications Administered During Visit:                  Medication Dose Route   Sodium Chloride 0.9% 1000 mL IV Piggyback   ondansetron 4 mg IV Push   dicyclomine 20 mg Oral       Patient Medication List:              levocetirizine (levocetirizine 5 mg oral tablet)  levothyroxine  (Synthroid  112 mcg (0.112 mg) oral tablet) 1 Tabs Oral (given by mouth) every day.  ondansetron (Zofran 4 mg oral tablet) 1 Tabs Oral (given by mouth) every 4 hours as needed nausea for 3 Days. Refills: 0.  tiZANidine  (tiZANidine  2 mg oral tablet)         Major Tests and Procedures:  The following procedures and tests were performed during your ED visit.  COMMONPROCEDURES%>  COMMON PROCEDURESCOMMENTS%>          Laboratory Orders  Name Status Details   .Trop 5th Gen Completed Blood, Stat, ST - Stat, Collected, 01/19/19 16:09:00 EST, 01/19/19 16:09:00 EST, Nurse collect, 01/19/19 16:09:00 EST, CALDWELL-PAC,  KELSEY C, [id number]   Add On Completed Blood, Stat, ST - Stat, Collected, 01/19/19 16:03:00 EST, 01/19/19 16:03:00 EST, CALDWELL-PAC,  KELSEY C, Print label Y/N, d-dimer, Draw Stat   BMP Completed Blood, Stat, ST - Stat, 01/19/19 15:53:00 EST, 01/19/19 15:53:00 EST, Nurse collect, CALDWELL-PAC,  KELSEY C, Print label Y/N    CBCDIFF Completed Blood, Stat, ST - Stat, 01/19/19 15:53:00 EST, 01/19/19 15:53:00 EST, Nurse collect, CALDWELL-PAC,  KELSEY C, Print label Y/N   D Dimer Completed Blood, Stat, ST - Stat, Collected, 01/19/19 16:09:00 EST Mar, RN, Aleck, 01/19/19 16:09:00 EST, Nurse collect, Venous Draw, 01/19/19 16:16:00 EST, BH CP Login, CALDWELL-PAC,  KELSEY C, Print label Y/N, bh1_spec_lbl1, 1.8 mL Blue/*C*/   NTproBNP Completed Blood, Stat, ST - Stat, 01/19/19 15:53:00 EST, 01/19/19 15:53:00 EST, Nurse collect, CALDWELL-PAC,  KELSEY C, Print label Y/N   Trop T Completed Blood, Stat, ST - Stat, 01/19/19 15:53:00 EST, 01/19/19 15:53:00 EST, Nurse collect, CALDWELL-PAC,  KELSEY C, Print label Y/N   XTube Blue Completed Blood, Stat, ST - Stat, Collected, 01/19/19 16:09:00 EST [id number], 01/19/19 16:09:00 EST, Nurse collect, Venous Draw, 01/19/19 16:16:00 EST, BH CP Login, CALDWELL-PAC,  KELSEY C, Print label Y/N, bh1_spec_lbl1, 1.8 mL Aolz/*[id number]*/, Complete               Radiology Orders  Name Status Details   XR Chest 1 View Portable Completed  01/19/19 15:05:00 EST, STAT 1 hour or less, Reason: Shortness of breath  (SOB), Transport Mode: Portable, pp_set_radiology_subspecialty               Patient Care Orders  Name Status Details   COVID-19 Status Ordered 01/19/19 14:52:47 EST, NOT VALID FOR pharmacy, laboratory, radiology., 01/19/19 14:52:47 EST, COVID-19 Detected   Cardiac/NIBP/Pulse Ox Monitoring Completed 01/19/19 15:05:00 EST, This message can only be seen by Nursing, it is not visible to Pharmacy, Laboratory, or Radiology., 01/19/19 15:05:00 EST, 01/19/19 15:05:00 EST, Once   Discharge Patient Ordered 01/19/19 17:10:00 EST   ED Assessment Adult Completed 01/19/19 14:52:47 EST, 01/19/19 14:52:47 EST   ED Secondary Triage Completed 01/19/19 14:52:47 EST, 01/19/19 14:52:47 EST   ED Triage Adult Completed 01/19/19 14:38:20 EST, 01/19/19 14:38:20 EST   Notify Provider Completed 01/19/19 14:52:47 EST, This message can  only be seen by Nursing, it is not visible to Pharmacy, Laboratory, or Radiology., 01/19/19 14:52:47 EST   Place Isolation Order Ordered 01/19/19 14:52:48 EST, 01/19/19 14:52:48 EST   Saline Lock Insert Completed 01/19/19 15:53:00 EST, Once, 01/19/19 15:53:00 EST             PROVIDER INFORMATION               Provider Role Assigned Sampson REENA LARRAINE JAYSON ED MidLevel 01/19/2019 14:43:39    Mar RN, Aleck ED Nurse 01/19/2019 14:44:30        Attending Physician:  ARMIDA DOFFING L     Admit Doc  CALDWELL-MD,  LAURA L     Consulting Doc       VITALS INFORMATION  Vital Sign Triage Latest   Temp Oral ORAL_1%>36.7 degC ORAL%>36.7 degC   Temp Temporal TEMPORAL_1%> TEMPORAL%>   Temp Intravascular INTRAVASCULAR_1%> INTRAVASCULAR%>   Temp Axillary AXILLARY_1%> AXILLARY%>   Temp Rectal RECTAL_1%> RECTAL%>   02 Sat 99 % 99 %   Respiratory Rate RATE_1%>20 br/min RATE%>18 br/min   Peripheral Pulse Rate PULSE RATE_1%>90 bpm PULSE RATE%>85 bpm   Apical Heart Rate HEART RATE_1%> HEART RATE%>   Blood Pressure BLOOD PRESSURE_1%>/ BLOOD PRESSURE_1%>86 mmHg BLOOD PRESSURE%>103 mmHg / BLOOD PRESSURE%>85 mmHg                 Immunizations      No Immunizations Documented This Visit          DISCHARGE INFORMATION   Discharge Disposition: H Outpt-Sent Home   Discharge Location:    Home   Discharge Date and Time:    01/19/2019 17:36:35   ED Checkout Date and Time:    01/19/2019 17:36:35     DEPART REASON INCOMPLETE INFORMATION               Depart Action Incomplete Reason   Interactive View/I&O Recently assessed               Problems      No Problems Documented              Smoking Status      Former smoker, quit more than 30 days ago         PATIENT EDUCATION INFORMATION  Instructions:        COVID Discharge Instructions (CUSTOM); Shortness of Breath, Easy-to-Read; Generalized Anxiety Disorder; Nausea, Adult, Easy-to-Read     Follow up:                    With: Address: When:   Follow up with primary care provider   Within  1 week   Comments:   Continue to quarantine yourself to ensure that you do not infect others.   If you develop any new or changing symptoms including fevers, shortness of breath, other new or changing symptoms, return to the emergency department for evaluation.     Take Zofran for nausea relief   I recommend supportive measures, including increased fluid intake, pain control with Motrin or Tylenol, mucinex-DM.           ED PROVIDER DOCUMENTATION     Patient:   ABRAHIM, SARGENT             MRN: [id number]            FIN: [id number]               Age:   34 years     Sex:  Male     DOB:  05/18/1975   Associated Diagnoses:   Dyspnea; COVID-19; Nausea; Anxiety   Author:   REENA LARRAINE BROCKS      Basic Information   Time seen: Provider Seen (ST)   ED Provider/Time:    CALDWELL-PAC,  KELSEY C / 01/19/2019 14:43  .   Additional information: Chief Complaint from Nursing Triage Note   Chief Complaint  Chief Complaint: COVID+ 01/12/19. c/o SOB and social anxiety related to stressors at home (01/19/19 14:50:00).      History of Present Illness   43 year old male with past medical history of anxiety presents to emergency room due to shortness of breath.  Patient tested positive for COVID-19 on 01/12/2019.  Since then he has had mild symptoms including dry cough, nausea, diarrhea, and body aches.  This past week he start developing shortness of breath that is worse with exertion, and relieved at rest.  He is extremely anxious about possible cardiac or pulmonary conditions that are happening.  He does report that he had ENT surgery 3 weeks ago due to a deviated septum.  This procedure went without complication.  He is concerned that the surgery could also be causing his shortness of breath, he states that he needs to find out what is going on.  Patient denies any fever, chills, chest pain, abdominal pain, vomiting, dysuria, headache, dizziness.  .        Review of Systems   Constitutional symptoms:  No fever, no chills.     Skin symptoms:  No rash,    Eye symptoms:  Vision unchanged.   ENMT symptoms:  No sore throat,    Respiratory symptoms:  Shortness of breath, cough, no sputum production, no wheezing.    Cardiovascular symptoms:  No chest pain,    Gastrointestinal symptoms:  Nausea, diarrhea, no abdominal pain, no vomiting.    Genitourinary symptoms:  No dysuria,    Musculoskeletal symptoms:  Muscle pain.   Neurologic symptoms:  No headache,              Additional review of systems information: All other systems reviewed and otherwise negative.      Health Status   Allergies:    Allergic Reactions (All)  No Known Medication Allergies.      Past Medical/ Family/ Social History   Medical history:    No active or resolved past medical history items have been selected or recorded., Reviewed as documented in chart.   Surgical history:    No active procedure history items have been selected or recorded., Reviewed as documented in chart.   Family history: Reviewed as documented in  chart.   Social history:    Social & Psychosocial Habits    Alcohol  01/19/2019  Use: Denies    Substance Use  01/19/2019  Use: Denies    Tobacco  01/19/2019  Use: Former smoker, quit more    Comment: pt uses nicotine  pouches - 01/19/2019 15:26 - Mar, RN, Aleck  , Reviewed as documented in chart.   Problem list:    Active Problems (3)  Anxiety   COVID-19   Deviated septum   .      Physical Examination               Vital Signs   Vital Signs   01/19/2019 15:37 EST Systolic Blood Pressure 104 mmHg    Diastolic Blood Pressure 82 mmHg    Peripheral Pulse Rate 90 bpm    Heart Rate Monitored 89 bpm    Respiratory Rate 12 br/min    Mean Arterial Pressure, Cuff 88 mmHg    SpO2 93 %   01/19/2019 14:50 EST Systolic Blood Pressure 133 mmHg    Diastolic Blood Pressure 86 mmHg    Temperature Oral 36.7 degC    Heart Rate Monitored 96 bpm    Respiratory Rate 20 br/min    SpO2 99 %   .   Measurements   01/19/2019 14:52 EST Body Mass Index est meas 31.80 kg/m2    Body  Mass Index Measured 31.80 kg/m2   01/19/2019 14:50 EST Height/Length Measured 167 cm    Weight Dosing 88.7 kg   .   Basic Oxygen Information   01/19/2019 15:37 EST SpO2 93 %   01/19/2019 14:50 EST Oxygen Therapy Room air    SpO2 99 %   .   General:  Alert, no acute distress, anxious.    Skin:  Warm, dry, pink.    Head:  Normocephalic.   Neck:  Supple, trachea midline.    Eye:  Pupils are equal, round and reactive to light.   Ears, nose, mouth and throat:  Oral mucosa moist, no pharyngeal erythema or exudate.    Cardiovascular:  Regular rate and rhythm.   Respiratory:  Lungs are clear to auscultation, respirations are non-labored, breath sounds are equal, Symmetrical chest wall expansion.    Back:  Normal range of motion.   Musculoskeletal:  Normal ROM, no tenderness.    Chest wall   Gastrointestinal:  Soft, Nontender, Non distended.    Neurological:  Alert and oriented to person, place, time, and situation, No focal neurological deficit observed, CN II-XII intact, normal sensory observed, normal motor observed.    Lymphatics:  No lymphadenopathy.   Psychiatric:  Cooperative, appropriate mood & affect.       Medical Decision Making   Rationale:  PA/NP reviewed with co-signing physician: ECG, lab results, radiology studies, medication, diagnosis, and plan of care.   Documents reviewed:  Emergency department nurses' notes, flowsheet, emergency department records, prior records, vital signs, vital signs.    Electrocardiogram:  Time 01/19/2019 15:22:00, rate 93, normal sinus rhythm, No ST-T changes, no ectopy, normal PR & QRS intervals, EKG interpreted by me and attending ER physician.SABRA    Results review:  Lab results : Lab View   01/19/2019 16:43 EST Estimated Creatinine Clearance 98.90 mL/min   01/19/2019 16:09 EST WBC 8.2 x10e3/mcL    RBC 5.22 x10e6/mcL    Hgb 15.3 g/dL    HCT 55.7 %    MCV 15.2 fL    MCH 29.3 pg    MCHC 34.6 g/dL  RDW 12.3 %    Platelet 189 x10e3/mcL    MPV 9.3 fL    Neutro Auto 75.9 %  HI     Neutro Absolute 6.2 x10e3/mcL    Immature Grans Percent 0.4 %    Immature Grans Absolute 0.03 x10e3/mcL    Lymph Auto 14.4 %  LOW    Lymph Absolute 1.2 x10e3/mcL    Mono Auto 8.8 %    Mono Absolute 0.7 x10e3/mcL    Eosinophil Percent 0.4 %    Eos Absolute 0.0 x10e3/mcL    Basophil Auto 0.1 %    Baso Absolute 0.0 x10e3/mcL    D Dimer <=0.27 mcg/mL FEU    Sodium Lvl 134 mmol/L  LOW    Potassium Lvl 5.0 mmol/L    Chloride 98 mmol/L    CO2 24 mmol/L    Glucose Random 97 mg/dL    BUN 12 mg/dL    Creatinine Lvl 1.0 mg/dL    AGAP 12 mmol/L    Osmolality Calc 268 mOsm/kg  LOW    Calcium  Lvl 9.0 mg/dL    eGFR AA 893 fO/fpw/8.26f    eGFR Non-AA 92 mL/min/1.64m    Trop T Quant <0.010 ng/mL    Trop 5th Gen Study <6.0 ng/L    NTproBNP <5 pg/mL   01/19/2019 14:52 EST Estimated Creatinine Clearance 98.90 mL/min   , Pulse Oximetry is greater than 94% on room air which is acceptable per my interpretation.    Radiology results:  Rad Results (ST)   XR Chest 1 View Portable  ?  01/19/19 15:29:59  Chest AP: 01/19/19    INDICATION:Shortness of breath (SOB).    COMPARISON: None    FINDINGS:    Diffuse peribronchial cuffing and interstitial prominence. No effusion or  pneumothorax.    Cardiomediastinal silhouette within normal limits.    No acute bony abnormality.    IMPRESSION:  Peribronchial cuffing and interstitial prominence, which may reflect viral  infection.  ?  Signed By: GILES DORISE LEVER  .   Notes:  Patient was evaluated emergency department for symptoms described in history of present illness.  Patient is well-appearing, oxygenating well on room air, lungs are clear, able to speak in full sentences.  History and physical exam are consistent with COVID-19 infection.  Due to complaint of shortness of breath, and multiple other complaints a work-up was initiated.  Lab work was reassuring.  D-dimer was negative.  Chest x-ray shows evidence of viral infection.  EKG shows no evidence of acute ischemia or infarct.  Patient is  not a candidate for admission at this time.  I spoken to patient about signs and symptoms for which return to emergency department.  Patient will benefit from supportive measures, including increased fluid intake, pain control with Motrin or Tylenol, mucinex-DM. Patient continues to rest comfortably in exam room, smiling and nontoxic appearing.  Remains hemodynamically stable.  No evidence of distress or discomfort. Safe for discharge home.  He/she was evaluated in the context of a global COVID 19 pandemic, which necessitated consideration that the patient might be at risk for the SARS-CoV-2 virus that causes COVID-19.  Institutional protocols and algorithms that pertain to the evaluation patient is at risk for COVID-19 are in a state of rapid change based on information release by regulatory bodies including the CDC and federal and state organizations.  These policies and algorithms were followed during the patient's care in the ED.   SABRA      Reexamination/ Reevaluation   Time:  01/19/2019 17:00:00 .   Vital signs   Basic Oxygen Information   01/19/2019 15:37 EST SpO2 93 %   01/19/2019 14:50 EST Oxygen Therapy Room air    SpO2 99 %      Course: improving.   Pain status: decreased.   Notes: Patient's physical exam, oxygen saturation and chest x-ray findings all do not indicate need for inpatient care at present time.  Patient was given precautions both verbal and written and recommendation for home quarantine..      Impression and Plan   Diagnosis   Dyspnea (ICD10-CM R06.00, Discharge, Medical)   COVID-19 (ICD10-CM U07.1, Discharge, Medical)   Nausea (ICD10-CM R11.0, Discharge, Medical)   Anxiety (ICD10-CM F41.9, Discharge, Medical)   Plan   Condition: Improved, Stable.    Disposition: Discharged: Time  01/19/2019 17:10:00, to home.    Prescriptions: Launch prescriptions   Pharmacy:  Zofran 4 mg oral tablet (Prescribe): 4 mg, 1 tabs, Oral, q4hr, for 3 days, PRN: nausea, 12 tabs, 0 Refill(s).    Patient was given the  following educational materials: Nausea, Adult, Easy-to-Read, Generalized Anxiety Disorder, Shortness of Breath, Easy-to-Read,  COVID Discharge Instructions (CUSTOM).    Follow up with: Follow up with primary care provider Within 1 week Continue to quarantine yourself to ensure that you do not infect others.   If you develop any new or changing symptoms including fevers, shortness of breath, other new or changing symptoms, return to the emergency department for evaluation.    Take Zofran for nausea relief  I recommend supportive measures, including increased fluid intake, pain control with Motrin or Tylenol, mucinex-DM.SABRA    Counseled: Patient.    Notes: I had a detailed discussion with the patient and/or guardian regarding the historical points/exam findings and test results supporting the discharge diagnosis. Also discussed need for outpatient followup for continue management and reevaluation of current symptoms. Discussed the need to return to the ER if symptoms persist/worsen, or for any questions/concerns that arise at home.

## 2019-09-09 LAB — TSH WITH REFLEX TO FT4: TSH: 1.11 mcIU/mL (ref 0.358–3.740)

## 2020-02-15 LAB — CBC
Hematocrit: 48.2 % (ref 38.0–52.0)
Hemoglobin: 15.9 g/dL (ref 13.0–17.3)
MCH: 28.5 pg (ref 27.0–34.5)
MCHC: 33 g/dL (ref 32.0–36.0)
MCV: 86.4 fL (ref 84.0–100.0)
MPV: 10.4 fL (ref 7.2–13.2)
NRBC Absolute: 0 10*3/uL (ref 0.000–0.012)
NRBC Automated: 0 % (ref 0.0–0.2)
Platelets: 305 10*3/uL (ref 140–440)
RBC: 5.58 x10e6/mcL (ref 4.00–5.60)
RDW: 13.2 % (ref 11.0–16.0)
WBC: 6.8 10*3/uL (ref 3.8–10.6)

## 2020-02-15 LAB — COMPREHENSIVE METABOLIC PANEL
ALT: 32 U/L (ref 0–41)
AST: 22 U/L (ref 0–40)
Albumin/Globulin Ratio: 1.8 mmol/L (ref 1.00–2.70)
Albumin: 4.4 g/dL (ref 3.5–5.2)
Alk Phosphatase: 71 U/L (ref 40–130)
Anion Gap: 12 mmol/L (ref 2–17)
BUN: 18 mg/dL (ref 6–20)
CO2: 24 mmol/L (ref 22–29)
Calcium: 9.1 mg/dL (ref 8.6–10.0)
Chloride: 104 mmol/L (ref 98–107)
Creatinine: 1 mg/dL (ref 0.7–1.3)
GFR African American: 105 mL/min/{1.73_m2} (ref >=90)
GFR Non-African American: 90 mL/min/1.73m^2 (ref >=90)
Globulin: 2 g/dL (ref 1.9–4.4)
Glucose: 98 mg/dL (ref 70–99)
Osmolaliy Calculated: 281 mosm/kg (ref 270–287)
Potassium: 4.4 mmol/L (ref 3.5–5.3)
Sodium: 140 mmol/L (ref 135–145)
Total Bilirubin: 0.2 mg/dL (ref 0.00–1.20)
Total Protein: 6.8 g/dL (ref 6.4–8.3)

## 2020-02-15 LAB — LIPID PANEL
Chol/HDL Ratio: 7.4 — ABNORMAL HIGH (ref 0.0–4.4)
Cholesterol: 186 mg/dL (ref 100–200)
HDL: 25 mg/dL — ABNORMAL LOW (ref >=40)
LDL Cholesterol: 121.2 mg/dL — ABNORMAL HIGH (ref 0.0–100.0)
LDL/HDL Ratio: 4.8
Triglycerides: 199 mg/dL — ABNORMAL HIGH (ref 0–149)
VLDL: 39.8 mg/dL (ref 5.0–40.0)

## 2020-02-15 LAB — TSH REFLEX TO FT4, T3: TSH: 2.66 u[IU]/mL (ref 0.358–3.740)

## 2020-09-03 NOTE — Telephone Encounter (Signed)
PATIENT IS REQ A REFILL ON IBUPROFEN 800 MG, [facility] ON FILE.

## 2020-09-06 DIAGNOSIS — M542 Cervicalgia: Secondary | ICD-10-CM

## 2020-09-07 ENCOUNTER — Encounter: Attending: Sports Medicine | Primary: Family Medicine

## 2020-09-07 NOTE — Progress Notes (Deleted)
CC: No chief complaint on file.      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 He is unable to identify any injury that may have contributed to his pain. He reports the pain starts over the anterior aspect of his shoulder and radiates down his biceps. It is worse at night. He describes it as throbbing and aching sensation. He rates it as a 4-6 out of 10. It is worse with certain aspects of range of motion. He denies any neck pain, numbness or tingling. He has not previously received any injections or physical therapy. He is utilizing over-the-counter NSAID's for his pain. He has experienced a similar pain on his right shoulder, which eventually required surgical intervention. His medical history is significant for a diagnosis of hypothyroidism.  09/07/20: At the last appointment we provided the patient with 80 mg Depo-Medrol, a short course of prednisone, and a referral to physical therapy.        Current Outpatient Medications on File Prior to Visit   Medication Sig Dispense Refill    levocetirizine (XYZAL ALLERGY 24HR) 5 MG tablet Take 1 tablet by mouth In the evening once a day      cephALEXin (KEFLEX) 500 MG capsule Take by mouth 1 capsule 3 times a daily      ibuprofen (ADVIL;MOTRIN) 800 MG tablet Take by mouth 1 tablet as needed for pain Take with food every 8 hours      Azelastine-Fluticasone 137-50 MCG/ACT SUSP 1 spray in each nostril Nasally Twice a day for 30 day(s)      escitalopram (LEXAPRO) 10 MG tablet 1 tablet Orally Once a day for 30 day(s)      levothyroxine (SYNTHROID) 112 MCG tablet TAKE 1 TABLET ONCE DAILY IN              THE MORNING ON AN EMPTY                  STOMACH. DOSAGE REDUCTION,               CANCEL SYNTHROID TAB 0.125MG for 90      tiZANidine (ZANAFLEX) 2 MG tablet 1 tablet as needed Orally Three times a day       No current facility-administered medications on file prior to visit.           Allergies   Allergen Reactions    Cat Hair Extract     Other      Grass Mix Pollens Allergen Ext         Past Medical History:   Diagnosis Date    Allergic sinusitis     Back     Hypothyroidism          Past Surgical History:   Procedure Laterality Date    KIDNEY STONE SURGERY      OTHER SURGICAL HISTORY Right     labrum     No surgery found No surgery found       There were no vitals taken for this visit.       Exam:  Shoulder Exam:  Cervical Spine   Extension Negative  Flexion Negative  Right Axial Rotation Negative  Left Axial Rotation Negative  Right Lateral Bending Negative  Left Lateral Bending Negative  Right Spurling Negative  Left Spurling Negative    Atrophy   Left Atrophy None    ROM   Right ATE 145  Left ATE 130  Right ER at 0 60  Left ER at 0 30  Right IR at 0 T8  Left IR at 0 L2 P  Right ER at 90 100  Left ER at 90 90  Right IR at 90 70  Left IR at 90 50    Scapula   Left Scapula Negative    Rotator Cuff   Right IS 5  Left IS 5  Right Napolean 5  Left Napolean 5  Right SS 5  Left SS 4+ P    Impingement   Left Hawkins Negative  Left Reverse Hawkin's Negative    Tenderness   Left AC Negative  Left Bicipital Groove Negative  Left Coracoid Negative  Left SS Insertion Negative  Left IS Insertion Negative  Left Ant. Glenoid Negative  Left Post Glenoid Negative  General Examination:  GENERAL APPEARANCE: well developed, well nourished, Patient is alert and oriented X 3. No acute distress. Appropriate affect , well developed, well nourished, Patient is alert and oriented X 3. No acute distress. Appropriate affect .   NECK: Cervical spine exam as noted above, no cervical lymphadenopathy , Cervical spine exam as noted above, no cervical lymphadenopathy .   LYMPH NODES: no observable axillary or supraclavicular adenopathy , no observable axillary or supraclavicular adenopathy .   RESPIRATORY: regular, unlabored, good air movement without increased respiratory effort , regular, unlabored, good air movement  without increased respiratory effort.   SKIN: no suspicious lesions on exposed upper and lower extremities, warm and dry , no suspicious lesions on exposed upper and lower extremities, warm and dry .   PERIPHERAL PULSES: palpable in all 4 extremities , palpable in all 4 extremities .   NEUROLOGIC: nonfocal, gait and station stable, sensory exam intact, no evidence of abnormal reflexes or long tract signs , nonfocal, gait and station stable, sensory exam intact, no evidence of abnormal reflexes or long tract signs .   VASCULAR: warm extremities with good capillary refill , warm extremities with good capillary refill .   PSYCH: alert, oriented, cognitive function intact, judgment and insight good, mood/affect appropriate , alert, oriented, cognitive function intact, judgment and insight good, mood/affect appropriate .   UPPER EXTREMITIES Exam as noted above , Exam as noted above .   LOWER EXTREMITIES Bilateral lower extremities showed no effusion, ecchymosis, edema, atrophy or spasticity with range of motion that is acceptable for age , Bilateral lower extremities showed no effusion, ecchymosis, edema, atrophy or spasticity with range of motion that is acceptable for age .   RADIOGRAPHS:  Interpretation ____ , ____.         No image results found.         Assessment:  1. Cervicalgia  Overview:  Slight reverse lordosis  2. Left shoulder pain, unspecified chronicity  Overview:  Normal shoulder x-ray  3. Adhesive capsulitis of left shoulder  Overview:  Loss of range of motion in a patient with hypothyroidism         No follow-ups on file.    Angelina Ok, MD

## 2020-09-21 ENCOUNTER — Encounter: Payer: PRIVATE HEALTH INSURANCE | Attending: Foot Surgery | Primary: Family Medicine

## 2020-09-28 ENCOUNTER — Ambulatory Visit
Admit: 2020-09-28 | Discharge: 2020-09-28 | Payer: PRIVATE HEALTH INSURANCE | Attending: Foot Surgery | Primary: Family Medicine

## 2020-09-28 ENCOUNTER — Ambulatory Visit: Admit: 2020-09-28 | Discharge: 2020-09-28 | Payer: PRIVATE HEALTH INSURANCE | Primary: Family Medicine

## 2020-09-28 DIAGNOSIS — S92502D Displaced unspecified fracture of left lesser toe(s), subsequent encounter for fracture with routine healing: Secondary | ICD-10-CM

## 2020-09-28 NOTE — Progress Notes (Signed)
Jacob Yu   July 07, 1975  45 y.o.  male    Chief Complaint   Patient presents with    Follow-up     Closed fracture of phalanx of LT second toe  DOI: 08/08/2020   Pt states LT 2nd toe feels better (no % given). He says he has been babying/ not pushing it.

## 2020-09-28 NOTE — Progress Notes (Signed)
Allergies   Allergen Reactions    Cat Hair Extract     Other      Grass Mix Pollens Allergen Ext        Current Outpatient Medications   Medication Sig Dispense Refill    Azelastine HCl 137 MCG/SPRAY SOLN       celecoxib (CELEBREX) 200 MG capsule       ibuprofen (ADVIL;MOTRIN) 800 MG tablet Take by mouth 1 tablet as needed for pain Take with food every 8 hours      Azelastine-Fluticasone 137-50 MCG/ACT SUSP 1 spray in each nostril Nasally Twice a day for 30 day(s)      levothyroxine (SYNTHROID) 112 MCG tablet TAKE 1 TABLET ONCE DAILY IN              THE MORNING ON AN EMPTY                  STOMACH. DOSAGE REDUCTION,               CANCEL SYNTHROID TAB 0.125MG for 90      tiZANidine (ZANAFLEX) 2 MG tablet 1 tablet as needed Orally Three times a day      levocetirizine (XYZAL ALLERGY 24HR) 5 MG tablet Take 1 tablet by mouth In the evening once a day      cephALEXin (KEFLEX) 500 MG capsule Take by mouth 1 capsule 3 times a daily      escitalopram (LEXAPRO) 10 MG tablet 1 tablet Orally Once a day for 30 day(s)       No current facility-administered medications for this visit.       HPI:   Patient seen today follow-up for a fracture in the second toe of the left foot. He states he is doing much better only has some mild discomfort  -  Examination:   General Examination:  MUSCULOSKELETAL:   On passive dorsiflexion, plantarflexion, inversion, eversion muscle strenth is 5/5. There is no evidence of atrophy.   -  NEUROLOGIC:   sensory exam intact.  -  VASCULAR:   Dorsalis pedis and posterior tibial arteries are palpable bilaterally but the capillary fill time is less than three seconds in all digits bilaterally. Skin temparature is warm to cool from tibial tuberosities to toes.   -  ORTHOPEDIC:  There is flexible contracture of the digits. There is good alignment of the second digit left foot noted. Patient does have some mild discomfort with palpation around the second digit left foot  -  DERMATOLOGY:  Skin is soft and  supple with good skin turgor. There is still some edema noted the second digit left foot.  -  ASSESSMENT:   1. Closed fracture of phalanx of left second toe with routine healing, subsequent encounter  -     XR FOOT LEFT (MIN 3 VIEWS) (CLINIC PERFORMED)     -  PLAN:    I reviewed the radiographs with the patient. I explained to him about the fracture appears to be healing well. He is instructed to continue with the Darco shoe and modification of his activity level. I did explain to him he was some mild discomfort and swelling for several months.    Electronically signed by Algis Downs, DPM on 09/28/2020 at 12:29 PM

## 2020-11-16 ENCOUNTER — Ambulatory Visit
Admit: 2020-11-16 | Discharge: 2020-11-16 | Payer: PRIVATE HEALTH INSURANCE | Attending: Family Medicine | Primary: Family Medicine

## 2020-11-16 DIAGNOSIS — E039 Hypothyroidism, unspecified: Secondary | ICD-10-CM

## 2020-11-16 MED ORDER — LEVOTHYROXINE SODIUM 112 MCG PO TABS
112 MCG | ORAL_TABLET | ORAL | 3 refills | Status: AC
Start: 2020-11-16 — End: ?

## 2020-11-16 NOTE — Patient Instructions (Signed)
If you have not heard from anyone about CPAP by November, call us back.    Please have labs drawn at least two days prior to next visit.  Please fast after midnight the evening prior to labs.

## 2020-11-16 NOTE — Assessment & Plan Note (Signed)
I will repeat labs in 3 months.

## 2020-11-16 NOTE — Assessment & Plan Note (Signed)
Uncontrolled.  I like to give a trial of AutoPap.  If his symptoms persist, I will consider an in lab sleep study with CPAP/BiPAP titration.

## 2020-11-16 NOTE — Progress Notes (Signed)
CHIEF COMPLAINT:  Chief Complaint   Patient presents with    Follow-up     DISCUSS SLEEP APNEA        HISTORY OF PRESENT ILLNESS:  Mr. Filippini is a 45 y.o. male  established patient of mine who presents for a medically necessary primary care follow up visit for management of his chronic conditions. He has some concerns regarding his chronic conditions.    Hypothyroidism:  This is long standing diagnosis for Mr. Wenke. His most recent thyroid labs were:   Lab Results   Component Value Date    TSHREFLEX 2.660 02/15/2020      11/16/20: Mr. Churchwell has no new concerns regarding his thyroid disease.      Diagnosed with OSA last year on HST. He had septoplasty last year with improvement, but now continues to snore, feel drowsy during the day and has Has upon waking. He is interested in therapy for OSA.     PHQ:  PHQ-9  11/16/2020   Little interest or pleasure in doing things 0   Feeling down, depressed, or hopeless 0   PHQ-2 Score 0   PHQ-9 Total Score 0       CURRENT MEDICATION LIST:  Current Outpatient Medications   Medication Sig Dispense Refill    levothyroxine (SYNTHROID) 112 MCG tablet TAKE 1 TABLET ONCE DAILY IN              THE MORNING ON AN EMPTY                  STOMACH. DOSAGE REDUCTION,               CANCEL SYNTHROID TAB 0.125MG for 90 90 tablet 3    celecoxib (CELEBREX) 200 MG capsule       levocetirizine (XYZAL) 5 MG tablet Take 1 tablet by mouth In the evening once a day      ibuprofen (ADVIL;MOTRIN) 800 MG tablet Take by mouth 1 tablet as needed for pain Take with food every 8 hours      Azelastine-Fluticasone 137-50 MCG/ACT SUSP 1 spray in each nostril Nasally Twice a day for 30 day(s)      tiZANidine (ZANAFLEX) 2 MG tablet 1 tablet as needed Orally Three times a day       No current facility-administered medications for this visit.     I have reviewed and reconciled the above medication list during today's office visit.     ALLERGIES:  Allergies   Allergen Reactions    Cat Hair Extract     Other      Grass  Mix Pollens Allergen Ext        HISTORY:  Past Medical History:   Diagnosis Date    Allergic sinusitis     Back     Closed fracture of phalanx of left second toe 08/08/2020    Hypothyroidism       Past Surgical History:   Procedure Laterality Date    KIDNEY STONE SURGERY      OTHER SURGICAL HISTORY Right     labrum        REVIEW OF SYSTEMS:  Review of systems was performed and was negative except as mentioned in the HPI.    PHYSICAL EXAM:  Vital Signs -   Vitals:    11/16/20 1127   BP: 138/82   Site: Left Upper Arm   Pulse: 93   Resp: (!) 98   Weight: 207 lb (93.9 kg)  GENERAL APPEARANCE: well developed, well nourished, Patient is alert and oriented X 3. No acute distress. Appropriate affect. Marland Kitchen   EARS: Hearing grossly normal  HEART: Regular rate and rhythm, no murmurs gallops or rubs   LUNGS: Lungs clear to auscultation bilaterally, no increased work of breathing   SKIN: no suspicious lesions, warm and dry .   EXTREMITIES: no edema  PSYCH:  alert, oriented, cognitive function intact, cooperative with exam,       IMPRESSION/PLAN:  1. Acquired hypothyroidism  Assessment & Plan:   I will repeat labs in 3 months.  Orders:  -     levothyroxine (SYNTHROID) 112 MCG tablet; TAKE 1 TABLET ONCE DAILY IN              THE MORNING ON AN EMPTY                  STOMACH. DOSAGE REDUCTION,               CANCEL SYNTHROID TAB 0.125MG for 90, Disp-90 tablet, R-3Normal  -     TSH with Reflex to FT4; Future  2. Obstructive sleep apnea  Comments:  HST 11/21/2018 showing Severe OSA. Initial AutoPap 5-20cmH2O written 11/16/20.  Assessment & Plan:   Uncontrolled.  I like to give a trial of AutoPap.  If his symptoms persist, I will consider an in lab sleep study with CPAP/BiPAP titration.  3. Adult general medical examination  -     Lipid Panel; Future  -     Comprehensive Metabolic Panel; Future  -     CBC with Auto Differential; Future  4. Screening for HIV without presence of risk factors  -     HIV Screen; Future  5. Encounter for  hepatitis C screening test for low risk patient  -     Hepatitis C Antibody; Future     Follow up and Dispositions:  Return in about 3 months (around 02/16/2021) for CPE with labs prior.       Rachel Moulds, M.D.

## 2020-11-29 NOTE — Telephone Encounter (Signed)
Contacted [facility] to see if fax was received. They did not, I faxed order for CPAP for pt. Contacted pt to let him know that order was faxed and he will be contacted when order is out for shipment to set up.

## 2020-11-29 NOTE — Telephone Encounter (Signed)
Pt calling to follow up on CPAP machine, pt states its been over 2 weeks and he has not heard back from company, please call pt, he was told to reach back out to office if he had not heard from company.

## 2020-12-12 NOTE — Telephone Encounter (Incomplete)
Pt is lookin  for a referral to etr a sleep pap machine

## 2020-12-17 NOTE — Telephone Encounter (Signed)
Contacted [facility] and order was received and pt will be contacted today.  Contacted pt to let him know that someone will contact him today about cpap set up.

## 2020-12-17 NOTE — Telephone Encounter (Signed)
Pt called , was in about a month ago and thought someone was going to  reach out to him regarding cpap machine, fitting etc.  [phone]

## 2020-12-24 NOTE — Telephone Encounter (Signed)
Pt.calling regarding status of c-pap machine. He can be reached at [phone]

## 2020-12-25 NOTE — Telephone Encounter (Signed)
Spoke to pt, he was given the information to [facility] [phone]

## 2021-01-18 NOTE — Telephone Encounter (Signed)
[place] from [employer] called, she needs office visit notes and copy of sleep study. Please fax to [phone].

## 2021-01-22 NOTE — Telephone Encounter (Signed)
Notes faxed to [facility]

## 2021-02-22 ENCOUNTER — Encounter

## 2021-02-22 ENCOUNTER — Ambulatory Visit
Admit: 2021-02-22 | Discharge: 2021-02-22 | Payer: PRIVATE HEALTH INSURANCE | Attending: Family Medicine | Primary: Family Medicine

## 2021-02-22 DIAGNOSIS — Z0000 Encounter for general adult medical examination without abnormal findings: Secondary | ICD-10-CM

## 2021-02-22 LAB — COMPREHENSIVE METABOLIC PANEL
ALT: 31 U/L (ref 0–50)
AST: 23 U/L (ref 0–50)
Albumin/Globulin Ratio: 2 (ref 1.00–2.70)
Albumin: 4.7 g/dL (ref 3.5–5.2)
Alk Phosphatase: 89 U/L (ref 40–130)
Anion Gap: 13 mmol/L (ref 2–17)
BUN: 20 mg/dL (ref 6–20)
CO2: 22 mmol/L (ref 22–29)
Calcium: 9.9 mg/dL (ref 8.6–10.0)
Chloride: 105 mmol/L (ref 98–107)
Creatinine: 1.1 mg/dL (ref 0.7–1.3)
Est, Glom Filt Rate: 84 mL/min/1.73m?? — ABNORMAL LOW (ref >=90)
Globulin: 2.3 g/dL (ref 1.9–4.4)
Glucose: 98 mg/dL (ref 70–99)
OSMOLALITY CALCULATED: 282 mOsm/kg (ref 270–287)
Potassium: 4.5 mmol/L (ref 3.5–5.3)
Sodium: 140 mmol/L (ref 135–145)
Total Bilirubin: 0.19 mg/dL (ref 0.00–1.20)
Total Protein: 7 g/dL (ref 6.4–8.3)

## 2021-02-22 LAB — LIPID PANEL
Chol/HDL Ratio: 8.6 — ABNORMAL HIGH (ref 0.0–4.4)
Cholesterol: 206 mg/dL — ABNORMAL HIGH (ref 100–200)
HDL: 24 mg/dL — ABNORMAL LOW (ref >=40)
Triglycerides: 432 mg/dL — ABNORMAL HIGH (ref 0–149)
VLDL: 86.4 mg/dL — ABNORMAL HIGH (ref 5.0–40.0)

## 2021-02-22 LAB — CBC WITH AUTO DIFFERENTIAL
Absolute Baso #: 0.1 10*3/uL (ref 0.0–0.2)
Absolute Eos #: 0.3 10*3/uL (ref 0.0–0.5)
Absolute Lymph #: 2.7 10*3/uL (ref 1.0–3.2)
Absolute Mono #: 0.8 10*3/uL (ref 0.3–1.0)
Basophils %: 0.6 % (ref 0.0–2.0)
Eosinophils %: 3.8 % (ref 0.0–7.0)
Hematocrit: 49.5 % (ref 38.0–52.0)
Hemoglobin: 17.2 g/dL (ref 13.0–17.3)
Immature Grans (Abs): 0.03 10*3/uL (ref 0.00–0.06)
Immature Granulocytes: 0.4 % (ref 0.0–0.6)
Lymphocytes: 32.4 % (ref 15.0–45.0)
MCH: 29.3 pg (ref 27.0–34.5)
MCHC: 34.7 g/dL (ref 32.0–36.0)
MCV: 84.2 fL (ref 84.0–100.0)
MPV: 9.9 fL (ref 7.2–13.2)
Monocytes: 9.9 % (ref 4.0–12.0)
NRBC Absolute: 0 10*3/uL (ref 0.000–0.012)
NRBC Automated: 0 % (ref 0.0–0.2)
Neutrophils %: 52.9 % (ref 42.0–74.0)
Neutrophils Absolute: 4.3 10*3/uL (ref 1.6–7.3)
Platelets: 350 10*3/uL (ref 140–440)
RBC: 5.88 x10e6/mcL — ABNORMAL HIGH (ref 4.00–5.60)
RDW: 13.1 % (ref 11.0–16.0)
WBC: 8.2 10*3/uL (ref 3.8–10.6)

## 2021-02-22 LAB — TSH WITH REFLEX: TSH: 3.29 mcIU/mL (ref 0.358–3.740)

## 2021-02-22 LAB — LDL CHOLESTEROL DIRECT, REFLEX: Direct LDL: 118 mg/dL — ABNORMAL HIGH (ref 0.00–100.00)

## 2021-02-22 LAB — HIV SCREEN: HIV Screen: NEGATIVE

## 2021-02-22 LAB — HEPATITIS C ANTIBODY: Hepatitis C Ab: NEGATIVE

## 2021-02-22 MED ORDER — BUPROPION HCL ER (XL) 150 MG PO TB24
150 MG | ORAL_TABLET | Freq: Every morning | ORAL | 5 refills | Status: AC
Start: 2021-02-22 — End: 2022-03-14

## 2021-02-22 NOTE — Assessment & Plan Note (Signed)
His 10-year ASCVD risk is greater than 10%.  I reviewed labs performed this morning and if still greater than 10% may consider moderate intensity statin therapy.  Of note, his risk will decrease significantly if he is able to quit smoking.

## 2021-02-22 NOTE — Assessment & Plan Note (Signed)
He has not tolerated chantix in the past. I'll give a trial of bupropion.

## 2021-02-22 NOTE — Progress Notes (Addendum)
CHIEF COMPLAINT:  Chief Complaint   Patient presents with    Annual Exam        HISTORY OF PRESENT ILLNESS:  Jacob Yu is a 46 y.o. male  who presents for a preventive health visit.   he also would like to discuss an acute concern. He continues to have difficulty with use of CPAP. Hx of OSA, but is not able to sleep with CPAP despite trying different settings and different facemasks.     Hypothyroidism:  This is long standing diagnosis for Jacob Yu. His most recent thyroid labs were:   Lab Results   Component Value Date    TSHREFLEX 3.290 02/22/2021      03/12/21: Jacob Yu has no new concerns regarding his thyroid disease.     PHQ:  PHQ-9  11/16/2020   Little interest or pleasure in doing things 0   Feeling down, depressed, or hopeless 0   PHQ-2 Score 0   PHQ-9 Total Score 0       CURRENT MEDICATION LIST:  Current Outpatient Medications   Medication Sig Dispense Refill    buPROPion (WELLBUTRIN XL) 150 MG extended release tablet Take 1 tablet by mouth every morning 30 tablet 5    levothyroxine (SYNTHROID) 112 MCG tablet TAKE 1 TABLET ONCE DAILY IN              THE MORNING ON AN EMPTY                  STOMACH. DOSAGE REDUCTION,               CANCEL SYNTHROID TAB 0.125MG for 90 90 tablet 3    celecoxib (CELEBREX) 200 MG capsule Take 200 mg by mouth as needed      levocetirizine (XYZAL) 5 MG tablet Take 1 tablet by mouth In the evening once a day      ibuprofen (ADVIL;MOTRIN) 800 MG tablet Take by mouth 1 tablet as needed for pain Take with food every 8 hours      Azelastine-Fluticasone 137-50 MCG/ACT SUSP 1 spray in each nostril Nasally Twice a day for 30 day(s)      tiZANidine (ZANAFLEX) 2 MG tablet 1 tablet as needed Orally Three times a day      rosuvastatin (CRESTOR) 10 MG tablet Take 1 tablet by mouth daily 90 tablet 3     No current facility-administered medications for this visit.       I have reviewed and reconciled the above medication list during today's office visit.     ALLERGIES:  Allergies   Allergen  Reactions    Cat Hair Extract     Other      Grass Mix Pollens Allergen Ext        HISTORY:  Past Medical History:   Diagnosis Date    Allergic sinusitis     Back     Closed fracture of phalanx of left second toe 08/08/2020    Hypothyroidism       Past Surgical History:   Procedure Laterality Date    KIDNEY STONE SURGERY      OTHER SURGICAL HISTORY Right     labrum      Social History     Tobacco Use    Smoking status: Some Days     Types: Cigarettes     Start date: 01/27/1994    Smokeless tobacco: Current    Tobacco comments:     03/09/2020   Vaping Use  Vaping Use: Never used   Substance Use Topics    Alcohol use: Never    Drug use: Never     Family History   Problem Relation Age of Onset    Hypertension Mother     Thyroid Disease Mother        REVIEW OF SYSTEMS:  Review of systems was performed and was negative except as mentioned in the HPI.    PHYSICAL EXAM:  Physical Exam  Vitals reviewed.   Constitutional:       Appearance: Normal appearance.   Cardiovascular:      Rate and Rhythm: Normal rate and regular rhythm.   Pulmonary:      Effort: Pulmonary effort is normal.      Breath sounds: Normal breath sounds.   Skin:     General: Skin is warm and dry.   Neurological:      Mental Status: He is alert.   Psychiatric:         Mood and Affect: Mood normal.        LABS:  Lab Results   Component Value Date    NA 140 02/22/2021    K 4.5 02/22/2021    CL 105 02/22/2021    CO2 22 02/22/2021    BUN 20 02/22/2021    CREATININE 1.1 02/22/2021    GLUCOSE 98 02/22/2021    CALCIUM 9.9 02/22/2021    PROT 7.0 02/22/2021    LABALBU 4.7 02/22/2021    BILITOT 0.19 02/22/2021    ALKPHOS 89 02/22/2021    AST 23 02/22/2021    ALT 31 02/22/2021    LABGLOM 84 (L) 02/22/2021    GFRAA 105 02/15/2020    GLOB 2.3 02/22/2021       Lab Results   Component Value Date    WBC 8.2 02/22/2021    HGB 17.2 02/22/2021    HCT 49.5 02/22/2021    MCV 84.2 02/22/2021    PLT 350 02/22/2021        Lab Results   Component Value Date    CHOL 206 (H)  02/22/2021     Lab Results   Component Value Date    TRIG 432 (H) 02/22/2021     Lab Results   Component Value Date    HDL 24 (L) 02/22/2021     Lab Results   Component Value Date    LDLCHOLESTEROL NOTE 02/22/2021       The 10-year ASCVD risk score (Arnett DK, et al., 2019) is: 15.8%    Values used to calculate the score:      Age: 61 years      Sex: Male      Is Non-Hispanic African American: No      Diabetic: No      Tobacco smoker: Yes      Systolic Blood Pressure: 536 mmHg      Is BP treated: No      HDL Cholesterol: 24 mg/dL      Total Cholesterol: 206 mg/dL     I have reviewed the above labs with the patient during today's office visit.    IMPRESSION/PLAN:  1. Encounter for well adult exam without abnormal findings  2. Obstructive sleep apnea  Assessment & Plan:  Uncontrolled.  He has not tolerated CPAP well.  I will refer to otolaryngology for further evaluation and for discussion of alternate treatment methods.   Orders:  -     External Referral To Otolaryngology (ENT)  3. Acquired hypothyroidism  Assessment & Plan:  I'll repeat labs today and make therapy adjustments based on the results.     4. Pure hypercholesterolemia  Assessment & Plan:  His 10-year ASCVD risk is greater than 10%.  I reviewed labs performed this morning and if still greater than 10% may consider moderate intensity statin therapy.  Of note, his risk will decrease significantly if he is able to quit smoking.   5. Encounter for immunization  6. Personal history of tobacco use, presenting hazards to health  -     PR TOBACCO USE CESSATION INTERMEDIATE 3-10 MINUTES [99406]  7. Need for prophylactic vaccination against Streptococcus pneumoniae (pneumococcus)  8. Other problems related to lifestyle  9. Screening for HIV without presence of risk factors  10. Need for hepatitis C screening test  11. Smoker  Assessment & Plan:  He has not tolerated chantix in the past. I'll give a trial of bupropion.    Orders:  -     buPROPion (WELLBUTRIN XL) 150  MG extended release tablet; Take 1 tablet by mouth every morning, Disp-30 tablet, R-5Normal  12. Encounter for colorectal cancer screening  -     Cologuard (Fecal DNA Colorectal Cancer Screening)  -     Amb External Referral To Gastroenterology  13. Positive colorectal cancer screening using Cologuard test  Comments:  03/12/21: Cologuard positive. I'll refer to Gastroenterology for further evaluation.  Orders:  -     Amb External Referral To Gastroenterology     This patient presented for a preventive health visit today. We discussed healthy eating and physical activity. Immunizations were discussed and updated as listed above. All age and gender appropriate screening based on the recommendations of the [place] [employer] were discussed and performed as listed below.    The natural history of prostate cancer and ongoing controversy regarding screening and potential treatment outcomes of prostate cancer has been discussed with the patient. The meaning of a false positive PSA and a false negative PSA has been discussed. He indicates understanding of the limitations of this screening test and wishes NOT to proceed with screening PSA testing.      Follow up and Dispositions:  Return in about 3 months (around 05/23/2021) for  smoking cessation.       Rachel Moulds, M.D.      Cardiovascular Disease Risk Counseling: Assessed the patient's risk to develop cardiovascular disease and reviewed main risk factors.   Reviewed steps to reduce disease risk including:   Quitting tobacco use, reducing amount smoked, or not starting the habit  Making healthy food choices  Being physically active and gradualy increasing activity levels   Reduce weight and determine a healthy BMI goal  Monitor blood pressure and treat if higher than 140/90 mmHg  Maintain blood total cholesterol levels under 5 mmol/l or 190 mg/dl  Maintain LDL cholesterol levels under 3.0 mmol/l or 115 mg/dl   Control blood glucose  levels    Provided a follow up plan.  Time spent (minutes): 10

## 2021-02-22 NOTE — Assessment & Plan Note (Signed)
Uncontrolled.  He has not tolerated CPAP well.  I will refer to otolaryngology for further evaluation and for discussion of alternate treatment methods.

## 2021-02-22 NOTE — Patient Instructions (Addendum)
Check at your pharmacy for the flu shot and the Prevnar 20 (pneumonia)     Quitting Tobacco: Care Instructions  Quitting tobacco is much harder than simply changing a habit. Nicotine cravings make it hard to quit, but you can do it. Your doctor will help you set up the plan that best meets your needs.  You will miss the nicotine at first. You may feel short-tempered and grumpy. You may have trouble sleeping or thinking clearly. The urge to use tobacco may continue for a time.   Combining tools can raise your chances of success. You can use medicine along with counseling. And you can join a quit-tobacco program, such as the American Lung Association's Freedom from Smoking program.     Get support.  Reach out to family and friends, and find a counselor to help you quit. Join a support group, such as Nicotine Anonymous. Go to www.smokefree.gov to sign up for text messaging support.     Talk to your doctor or pharmacist about medicines that can help you quit.  Medicines can help with cravings and withdrawal symptoms. There are several over-the-counter choices, such as nicotine patches, gum, and lozenges.     After you quit, do not use tobacco again, not even once.  Get rid of all tobacco products and anything that reminds you of tobacco, such as ashtrays.     Avoid things that make you reach for tobacco.  Change your daily routine. Take a different route to work, or eat a meal in a different place.     Try to cut down on stress.  Find ways to calm yourself, such as taking a hot bath or doing deep breathing exercises.     Eat a healthy diet, and get regular exercise.  Having healthy habits may help you quit using tobacco.     Don't give up on quitting if you use tobacco again.  Most people quit and restart a few times before they quit for good.   Follow-up care is a key part of your treatment and safety. Be sure to make and go to all appointments, and call your doctor if you are having problems. It's also a good idea to  know your test results and keep a list of the medicines you take.  Where can you learn more?  Go to https://www.bennett.info/ and enter Y522 to learn more about "Quitting Tobacco: Care Instructions."  Current as of: August 28, 2020               Content Version: 13.5  ?? 2006-2022 [employer].   Care instructions adapted under license by [facility]. If you have questions about a medical condition or this instruction, always ask your healthcare professional. Lynden any warranty or liability for your use of this information.

## 2021-02-22 NOTE — Assessment & Plan Note (Signed)
I'll repeat labs today and make therapy adjustments based on the results.

## 2021-02-24 ENCOUNTER — Encounter

## 2021-02-24 MED ORDER — ROSUVASTATIN CALCIUM 10 MG PO TABS
10 MG | ORAL_TABLET | Freq: Every day | ORAL | 3 refills | Status: DC
Start: 2021-02-24 — End: 2021-05-31

## 2021-02-25 NOTE — Other (Signed)
Pt told of results and understood.

## 2021-03-12 LAB — FECAL DNA COLORECTAL CANCER SCREENING (COLOGUARD): FIT-DNA (Cologuard): POSITIVE — AB

## 2021-03-12 NOTE — Addendum Note (Signed)
Addended by: Jovita Gamma on: 03/12/2021 09:33 PM     Modules accepted: Orders

## 2021-05-31 ENCOUNTER — Encounter

## 2021-05-31 ENCOUNTER — Ambulatory Visit
Admit: 2021-05-31 | Discharge: 2021-05-31 | Payer: PRIVATE HEALTH INSURANCE | Attending: Family Medicine | Primary: Family Medicine

## 2021-05-31 DIAGNOSIS — E7800 Pure hypercholesterolemia, unspecified: Secondary | ICD-10-CM

## 2021-05-31 LAB — LIPID PANEL
Chol/HDL Ratio: 4.1 (ref 0.0–4.4)
Cholesterol: 116 mg/dL (ref 100–200)
HDL: 28 mg/dL — ABNORMAL LOW (ref >=40)
LDL Cholesterol: 56 mg/dL (ref 0.0–100.0)
LDL/HDL Ratio: 2
Triglycerides: 160 mg/dL — ABNORMAL HIGH (ref 0–149)
VLDL: 32 mg/dL (ref 5.0–40.0)

## 2021-05-31 LAB — COMPREHENSIVE METABOLIC PANEL
ALT: 38 U/L (ref 0–50)
AST: 27 U/L (ref 0–50)
Albumin/Globulin Ratio: 1.8 (ref 1.00–2.70)
Albumin: 4.6 g/dL (ref 3.5–5.2)
Alk Phosphatase: 87 U/L (ref 40–130)
Anion Gap: 12 mmol/L (ref 2–17)
BUN: 20 mg/dL (ref 6–20)
CO2: 25 mmol/L (ref 22–29)
Calcium: 9.6 mg/dL (ref 8.6–10.0)
Chloride: 103 mmol/L (ref 98–107)
Creatinine: 0.9 mg/dL (ref 0.7–1.3)
Est, Glom Filt Rate: 107 mL/min/1.73m (ref >=60)
Globulin: 2.5 g/dL (ref 1.9–4.4)
Glucose: 87 mg/dL (ref 70–99)
OSMOLALITY CALCULATED: 281 mOsm/kg (ref 270–287)
Potassium: 4.4 mmol/L (ref 3.5–5.3)
Sodium: 140 mmol/L (ref 135–145)
Total Bilirubin: 0.48 mg/dL (ref 0.00–1.20)
Total Protein: 7.1 g/dL (ref 6.4–8.3)

## 2021-05-31 MED ORDER — NICOTINE 14 MG/24HR TD PT24
14 MG/24HR | MEDICATED_PATCH | Freq: Every day | TRANSDERMAL | 0 refills | Status: DC
Start: 2021-05-31 — End: 2021-12-06

## 2021-05-31 MED ORDER — PREGABALIN 75 MG PO CAPS
75 MG | ORAL_CAPSULE | Freq: Two times a day (BID) | ORAL | 5 refills | Status: AC
Start: 2021-05-31 — End: 2021-11-27

## 2021-05-31 MED ORDER — PREGABALIN 75 MG PO CAPS
75 MG | ORAL_CAPSULE | Freq: Two times a day (BID) | ORAL | 5 refills | Status: DC
Start: 2021-05-31 — End: 2021-05-31

## 2021-05-31 MED ORDER — ROSUVASTATIN CALCIUM 10 MG PO TABS
10 MG | ORAL_TABLET | Freq: Every day | ORAL | 3 refills | Status: DC
Start: 2021-05-31 — End: 2021-05-31

## 2021-05-31 MED ORDER — ROSUVASTATIN CALCIUM 10 MG PO TABS
10 MG | ORAL_TABLET | Freq: Every day | ORAL | 3 refills | Status: AC
Start: 2021-05-31 — End: 2021-10-25

## 2021-05-31 MED ORDER — NICOTINE 7 MG/24HR TD PT24
7 MG/24HR | MEDICATED_PATCH | Freq: Every day | TRANSDERMAL | 0 refills | Status: DC
Start: 2021-05-31 — End: 2021-12-06

## 2021-05-31 NOTE — Assessment & Plan Note (Signed)
Bupropion and chantix were not well tolerated.

## 2021-05-31 NOTE — Progress Notes (Signed)
Chief Complaint   Patient presents with    Follow-up     Discuss other options to quit smoking       Jacob Yu is a 46 y.o. male  established patient of mine who presents for a medically necessary primary care follow up visit for management of his chronic conditions. He has some concerns regarding his chronic conditions.    ASSESSMENT/PLAN:  1. Pure hypercholesterolemia  Overview:  05/31/21: The 10-year ASCVD risk score (Arnett DK, et al., 2019) is: 14.7%   Assessment & Plan:  I'll recheck lipids today to see if medication is effective.    Orders:  -     rosuvastatin (CRESTOR) 10 MG tablet; Take 1 tablet by mouth daily, Disp-90 tablet, R-3Normal  2. Smoker  Assessment & Plan:  Bupropion and chantix were not well tolerated.    Orders:  -     nicotine (NICODERM CQ) 14 MG/24HR; Place 1 patch onto the skin daily for 14 days, Disp-42 patch, R-0Normal  -     nicotine (NICODERM CQ) 7 MG/24HR; Place 1 patch onto the skin daily for 14 days, Disp-14 patch, R-0Normal  3. Lumbar radiculopathy  Assessment & Plan:  Followed by [facility]. Currently treated with celebrex and rec'd rhizotomy with benefit. He did not tolerate gabapentin. I'll give a trial of pregabalin.    Orders:  -     pregabalin (LYRICA) 75 MG capsule; Take 1 capsule by mouth 2 times daily for 180 days. Max Daily Amount: 150 mg, Disp-60 capsule, R-5Normal       Follow up and Dispositions:  Return in about 3 months (around 08/31/2021) for LBP (lyrica).     HISTORY OF PRESENT ILLNESS:  Jacob Yu has a long history of Hyperlipidemia. Since his last visit, he has no new concerns regarding his cholesterol or medications. Current therapy includes a moderate-intensity statin. His most recent lipid measurements were:   Lab Results   Component Value Date    CHOL 206 (H) 02/22/2021    CHOL 186 02/15/2020    CHOL 257 (H) 10/22/2018     Lab Results   Component Value Date    HDL 24 (L) 02/22/2021    HDL 25 (L) 02/15/2020    HDL 39 (L) 10/22/2018     Lab Results   Component  Value Date    LDLCHOLESTEROL NOTE 02/22/2021    LDLCHOLESTEROL 121.2 (H) 02/15/2020    LDLCHOLESTEROL 170.4 (H) 10/22/2018     The 10-year ASCVD risk score (Arnett DK, et al., 2019) is: 14.7%    Values used to calculate the score:      Age: 12 years      Sex: Male      Is Non-Hispanic African American: No      Diabetic: No      Tobacco smoker: Yes      Systolic Blood Pressure: 254 mmHg      Is BP treated: No      HDL Cholesterol: 24 mg/dL      Total Cholesterol: 206 mg/dL    HPI   He wishes to quit smoking, but bupropion and chantix have not helped in the past.     He has history of chronic low back pain and is followed by [facility].   REVIEW OF SYSTEMS:  Review of Systems    PHYSICAL EXAM:  Physical Exam  Vitals reviewed.   Constitutional:       General: He is not in acute distress.  Cardiovascular:  Rate and Rhythm: Normal rate and regular rhythm.      Pulses: Normal pulses.      Heart sounds: Normal heart sounds.   Pulmonary:      Effort: Pulmonary effort is normal.      Breath sounds: Normal breath sounds.   Abdominal:      General: Abdomen is flat. Bowel sounds are normal.      Palpations: Abdomen is soft.   Skin:     General: Skin is warm and dry.   Psychiatric:         Mood and Affect: Mood normal.         Rachel Moulds, M.D.    An electronic signature was used to authenticate this note.

## 2021-05-31 NOTE — Assessment & Plan Note (Signed)
Followed by [facility]. Currently treated with celebrex and rec'd rhizotomy with benefit. He did not tolerate gabapentin. I'll give a trial of pregabalin.

## 2021-05-31 NOTE — Patient Instructions (Signed)
I recommend calling 1-800-QUITNOW for help with quitting smoking.

## 2021-05-31 NOTE — Other (Signed)
Please call to notify Jacob Yu that his recent results show 53% reduction in LDL (bad) cholesterol on the rosuvastatin. This is working great and I recommend he continue it for now. Thanks!

## 2021-05-31 NOTE — Assessment & Plan Note (Signed)
I'll recheck lipids today to see if medication is effective.

## 2021-10-25 ENCOUNTER — Encounter

## 2021-10-25 MED ORDER — ROSUVASTATIN CALCIUM 10 MG PO TABS
10 MG | ORAL_TABLET | Freq: Every day | ORAL | 3 refills | Status: AC
Start: 2021-10-25 — End: 2022-08-15

## 2021-10-25 NOTE — Telephone Encounter (Signed)
Pt called [facility] rewquesting refill on   Rosuvastatin  qty 90    Pt reports he is out and has been but awould like to get back on it

## 2021-10-29 ENCOUNTER — Encounter

## 2021-10-29 MED ORDER — LEVOTHYROXINE SODIUM 112 MCG PO TABS
112 MCG | ORAL_TABLET | ORAL | 3 refills | Status: AC
Start: 2021-10-29 — End: 2022-10-17

## 2021-12-06 ENCOUNTER — Encounter
Admit: 2021-12-06 | Discharge: 2021-12-06 | Payer: PRIVATE HEALTH INSURANCE | Attending: Family Medicine | Primary: Family Medicine

## 2021-12-06 DIAGNOSIS — E039 Hypothyroidism, unspecified: Secondary | ICD-10-CM

## 2021-12-06 MED ORDER — CYCLOBENZAPRINE HCL 10 MG PO TABS
10 MG | ORAL_TABLET | Freq: Three times a day (TID) | ORAL | 0 refills | Status: AC | PRN
Start: 2021-12-06 — End: ?

## 2021-12-06 MED ORDER — PHENTERMINE HCL 15 MG PO CAPS
15 MG | ORAL_CAPSULE | Freq: Every morning | ORAL | 0 refills | Status: AC
Start: 2021-12-06 — End: 2022-01-05

## 2021-12-06 NOTE — Assessment & Plan Note (Signed)
Continue levothyroxine 112 mcg.  Given his difficulty losing weight, I will recheck labs with his next scheduled draw to see if adjustment of his thyroid medication dose could be helpful.

## 2021-12-06 NOTE — Assessment & Plan Note (Signed)
Well Controlled. No changes are planned today.  Continue rosuvastatin 10 mg.  I will recheck his labs in 3 months.

## 2021-12-06 NOTE — Assessment & Plan Note (Signed)
Uncontrolled.  He has significant obstructive sleep apnea and would benefit from weight loss.  He has had difficulty with weight loss despite lifestyle interventions.  I like to give a trial of phentermine to help with overeating.  I will start at a low-dose of 15 mg given his history of palpitations.  I will see him back relatively soon for recheck. The benefits and risks of this medication plan were discussed with the patient and we mutually agreed to a trial of therapy.

## 2021-12-06 NOTE — Assessment & Plan Note (Signed)
Continue as needed muscle relaxants.

## 2021-12-06 NOTE — Progress Notes (Signed)
Chief Complaint   Patient presents with    Follow-up     6 month f/u       Jacob Yu is a 46 y.o. male  established patient of mine who presents for a medically necessary primary care follow up visit for management of his chronic conditions. He has some concerns regarding his chronic conditions.    ASSESSMENT/PLAN:  1. Acquired hypothyroidism  Assessment & Plan:  Continue levothyroxine 112 mcg.  Given his difficulty losing weight, I will recheck labs with his next scheduled draw to see if adjustment of his thyroid medication dose could be helpful.   Orders:  -     Lipid Panel; Future  -     Comprehensive Metabolic Panel; Future  2. Pure hypercholesterolemia  Overview:  05/31/21: The 10-year ASCVD risk score (Arnett DK, et al., 2019) is: 14.7%   Assessment & Plan:  Well Controlled. No changes are planned today.  Continue rosuvastatin 10 mg.  I will recheck his labs in 3 months.   Orders:  -     TSH with Reflex; Future  3. Adult general medical examination  -     CBC with Auto Differential; Future  4. Class 1 obesity due to excess calories with serious comorbidity and body mass index (BMI) of 34.0 to 34.9 in adult  Assessment & Plan:  Uncontrolled.  He has significant obstructive sleep apnea and would benefit from weight loss.  He has had difficulty with weight loss despite lifestyle interventions.  I like to give a trial of phentermine to help with overeating.  I will start at a low-dose of 15 mg given his history of palpitations.  I will see him back relatively soon for recheck. The benefits and risks of this medication plan were discussed with the patient and we mutually agreed to a trial of therapy.     Orders:  -     phentermine 15 MG capsule; Take 1 capsule by mouth every morning for 30 days. Max Daily Amount: 15 mg, Disp-30 capsule, R-0Normal  5. Cervicalgia  Overview:  Slight reverse lordosis  Assessment & Plan:  Continue as needed muscle relaxants.   Orders:  -     cyclobenzaprine (FLEXERIL) 10 MG tablet;  Take 1 tablet by mouth 3 times daily as needed for Muscle spasms, Disp-30 tablet, R-0Normal       Follow up and Dispositions:  Return in about 3 months (around 03/08/2022) for CPE.     HISTORY OF PRESENT ILLNESS:  Jacob Yu has a long history of Hyperlipidemia. Since his last visit, he has no new concerns regarding his cholesterol or medications. Current therapy includes a moderate-intensity statin. His most recent lipid measurements were:   Lab Results   Component Value Date    CHOL 116 05/31/2021    CHOL 206 (H) 02/22/2021    CHOL 186 02/15/2020     Lab Results   Component Value Date    HDL 28 (L) 05/31/2021    HDL 24 (L) 02/22/2021    HDL 25 (L) 02/15/2020     Lab Results   Component Value Date    LDLCHOLESTEROL 56.0 05/31/2021    LDLCHOLESTEROL NOTE 02/22/2021    LDLCHOLESTEROL 121.2 (H) 02/15/2020     The ASCVD Risk score (Arnett DK, et al., 2019) failed to calculate for the following reasons:    The valid total cholesterol range is 130 to 320 mg/dL    Hypothyroidism:  This is long standing diagnosis for Jacob Yu. His  most recent thyroid labs were:   Lab Results   Component Value Date    TSHREFLEX 3.290 02/22/2021      12/06/21: Jacob Yu has no new concerns regarding his thyroid disease.     He has OSA, but cannot tolerate CPAP.  He is concerned that his weight is contributing to his obstructive sleep apnea.    He is concerned about his weight and has not been able to lose weight despite lifestyle interventions including low calorie and low carbohydrate diet.  His weight has been stable over the past 12 months despite changes in diet.  He does report frequent overeating with dinner and believes that this is worse due to his work schedule of working 12-hour shifts.       HPI     REVIEW OF SYSTEMS:  Review of Systems    PHYSICAL EXAM:  Physical Exam  Vitals reviewed.   Constitutional:       General: He is not in acute distress.     Appearance: He is obese.   Cardiovascular:      Rate and Rhythm: Normal rate and  regular rhythm.      Pulses: Normal pulses.      Heart sounds: Normal heart sounds.   Pulmonary:      Effort: Pulmonary effort is normal.      Breath sounds: Normal breath sounds.   Abdominal:      General: Abdomen is flat. Bowel sounds are normal.      Palpations: Abdomen is soft.   Skin:     General: Skin is warm and dry.   Psychiatric:         Mood and Affect: Mood normal.           Hulda Marin, M.D.    An electronic signature was used to authenticate this note.

## 2022-02-21 ENCOUNTER — Inpatient Hospital Stay: Admit: 2022-02-21 | Discharge: 2022-02-21 | Payer: PRIVATE HEALTH INSURANCE | Primary: Family Medicine

## 2022-02-21 DIAGNOSIS — Z0000 Encounter for general adult medical examination without abnormal findings: Secondary | ICD-10-CM

## 2022-02-21 DIAGNOSIS — E039 Hypothyroidism, unspecified: Secondary | ICD-10-CM

## 2022-02-21 LAB — COMPREHENSIVE METABOLIC PANEL
ALT: 42 U/L (ref 0–50)
AST: 26 U/L (ref 0–50)
Albumin/Globulin Ratio: 1 (ref 1.00–2.70)
Albumin: 4.4 g/dL (ref 3.5–5.2)
Alk Phosphatase: 91 U/L (ref 40–130)
Anion Gap: 11 mmol/L (ref 2–17)
BUN: 18 mg/dL (ref 6–20)
CO2: 25 mmol/L (ref 22–29)
Calcium: 9.6 mg/dL (ref 8.6–10.0)
Chloride: 106 mmol/L (ref 98–107)
Creatinine: 0.9 mg/dL (ref 0.7–1.3)
Est, Glom Filt Rate: 106 mL/min/1.73m (ref >=60)
Globulin: 3 g/dL (ref 1.9–4.4)
Glucose: 104 mg/dL — ABNORMAL HIGH (ref 70–99)
OSMOLALITY CALCULATED: 285 mOsm/kg (ref 270–287)
Potassium: 4.4 mmol/L (ref 3.5–5.3)
Sodium: 142 mmol/L (ref 135–145)
Total Bilirubin: 0.3 mg/dL (ref 0.00–1.20)
Total Protein: 7.4 g/dL (ref 6.4–8.3)

## 2022-02-21 LAB — CBC WITH AUTO DIFFERENTIAL
Absolute Baso #: 0.1 10*3/uL (ref 0.0–0.2)
Absolute Eos #: 0.5 10*3/uL (ref 0.0–0.5)
Absolute Lymph #: 3.1 10*3/uL (ref 1.0–3.2)
Absolute Mono #: 1 10*3/uL (ref 0.3–1.0)
Basophils %: 0.6 % (ref 0.0–2.0)
Eosinophils %: 5.5 % (ref 0.0–7.0)
Hematocrit: 49.7 % (ref 38.0–52.0)
Hemoglobin: 16.8 g/dL (ref 13.0–17.3)
Immature Grans (Abs): 0.02 10*3/uL (ref 0.00–0.06)
Immature Granulocytes: 0.2 % (ref 0.0–0.6)
Lymphocytes: 34 % (ref 15.0–45.0)
MCH: 29.3 pg (ref 27.0–34.5)
MCHC: 33.8 g/dL (ref 32.0–36.0)
MCV: 86.6 fL (ref 84.0–100.0)
MPV: 9 fL (ref 7.2–13.2)
Monocytes: 10.9 % (ref 4.0–12.0)
Neutrophils %: 48.8 % (ref 42.0–74.0)
Neutrophils Absolute: 4.4 10*3/uL (ref 1.6–7.3)
Platelets: 307 10*3/uL (ref 140–440)
RBC: 5.74 x10e6/mcL — ABNORMAL HIGH (ref 4.00–5.60)
RDW: 13 % (ref 11.0–16.0)
WBC: 9 10*3/uL (ref 3.8–10.6)

## 2022-02-21 LAB — LIPID PANEL
Chol/HDL Ratio: 4.6 — ABNORMAL HIGH (ref 0.0–4.4)
Cholesterol: 137 mg/dL (ref 100–200)
HDL: 30 mg/dL — ABNORMAL LOW (ref >=40)
LDL Cholesterol: 69 mg/dL (ref 0.0–100.0)
LDL/HDL Ratio: 2
Triglycerides: 184 mg/dL — ABNORMAL HIGH (ref 0–149)
VLDL: 36.8 mg/dL (ref 5.0–40.0)

## 2022-02-21 LAB — TSH WITH REFLEX: TSH: 1.6 mcIU/mL (ref 0.358–3.740)

## 2022-03-14 ENCOUNTER — Ambulatory Visit
Admit: 2022-03-14 | Discharge: 2022-03-14 | Payer: PRIVATE HEALTH INSURANCE | Attending: Family Medicine | Primary: Family Medicine

## 2022-03-14 ENCOUNTER — Encounter: Attending: Family Medicine | Primary: Family Medicine

## 2022-03-14 DIAGNOSIS — E7800 Pure hypercholesterolemia, unspecified: Secondary | ICD-10-CM

## 2022-03-14 DIAGNOSIS — Z0000 Encounter for general adult medical examination without abnormal findings: Secondary | ICD-10-CM

## 2022-03-14 MED ORDER — IPRATROPIUM BROMIDE 0.06 % NA SOLN
0.06 | Freq: Four times a day (QID) | NASAL | 3 refills | Status: AC
Start: 2022-03-14 — End: ?

## 2022-03-14 NOTE — Progress Notes (Signed)
CHIEF COMPLAINT:  Chief Complaint   Patient presents with    Annual Exam     CPE        HISTORY OF PRESENT ILLNESS:  Jacob Yu is a 47 y.o. male  who presents for a preventive health visit.     Jacob Yu is a 47 y.o. male  established patient of mine who presents for a medically necessary primary care follow up visit for management of Hypertension, Hypothyroidism and Hyperlipidemia.         Has used daily afrin for nasal congestion, but symptoms have been severe even with daily use. Sx of are worse off of afrin.     his most recent blood pressure measurements in the office have been.  BP Readings from Last 3 Encounters:   03/14/22 (!) 140/80   12/06/21 124/80   05/31/21 130/80        his most recent labs are listed below for review:  Lab Results   Component Value Date/Time    NA 142 02/21/2022 12:57 PM    K 4.4 02/21/2022 12:57 PM    CL 106 02/21/2022 12:57 PM    CO2 25 02/21/2022 12:57 PM    BUN 18 02/21/2022 12:57 PM    CREATININE 0.9 02/21/2022 12:57 PM    GLUCOSE 104 02/21/2022 12:57 PM    CALCIUM 9.6 02/21/2022 12:57 PM                    PHQ:      03/14/2022    11:10 AM   PHQ-9    Little interest or pleasure in doing things 0   Feeling down, depressed, or hopeless 0   PHQ-2 Score 0   PHQ-9 Total Score 0       CURRENT MEDICATION LIST:  Current Outpatient Medications   Medication Sig Dispense Refill    cetirizine (ZYRTEC) 10 MG tablet Take 1 tablet by mouth      ipratropium (ATROVENT) 0.06 % nasal spray 2 sprays by Each Nostril route 4 times daily 15 mL 3    cyclobenzaprine (FLEXERIL) 10 MG tablet Take 1 tablet by mouth 3 times daily as needed for Muscle spasms 30 tablet 0    levothyroxine (SYNTHROID) 112 MCG tablet TAKE 1 TABLET ONCE DAILY IN              THE MORNING ON AN EMPTY             STOMACH. DOSAGE REDUCTION,               CANCEL SYNTHROID TAB 0.125MCG 90 tablet 3    rosuvastatin (CRESTOR) 10 MG tablet Take 1 tablet by mouth daily 90 tablet 3    fluticasone (FLONASE) 50 MCG/ACT nasal spray   2 sprays,  Nasal, BID, # 16 g, 0 Refill(s)      celecoxib (CELEBREX) 200 MG capsule Take 1 capsule by mouth as needed      levocetirizine (XYZAL) 5 MG tablet Take 1 tablet by mouth In the evening once a day      ibuprofen (ADVIL;MOTRIN) 800 MG tablet Take by mouth 1 tablet as needed for pain Take with food every 8 hours      Azelastine-Fluticasone 137-50 MCG/ACT SUSP 1 spray in each nostril Nasally Twice a day for 30 day(s)       No current facility-administered medications for this visit.       I have reviewed and reconciled the above medication list during today's office visit.  ALLERGIES:  Allergies   Allergen Reactions    Cat Hair Extract     No Known Allergies     Other      Grass Mix Pollens Allergen Ext    Seasonal      Other reaction(s): ALLERGIC RHINITIS--SEVERITY VARIES        HISTORY:  Past Medical History:   Diagnosis Date    Allergic sinusitis     Back     Closed fracture of phalanx of left second toe 08/08/2020    Hypothyroidism       Past Surgical History:   Procedure Laterality Date    KIDNEY STONE SURGERY      OTHER SURGICAL HISTORY Right     labrum      Social History     Tobacco Use    Smoking status: Some Days     Types: Cigarettes     Start date: 01/27/1994    Smokeless tobacco: Current    Tobacco comments:     03/09/2020   Vaping Use    Vaping Use: Never used   Substance Use Topics    Alcohol use: Never    Drug use: Never     Family History   Problem Relation Age of Onset    Hypertension Mother     Thyroid Disease Mother        REVIEW OF SYSTEMS:  Review of systems was performed and was negative except as mentioned in the HPI.    PHYSICAL EXAM:  Physical Exam  Vitals reviewed.   Constitutional:       Appearance: Normal appearance.   Cardiovascular:      Rate and Rhythm: Normal rate and regular rhythm.   Pulmonary:      Effort: Pulmonary effort is normal.      Breath sounds: Normal breath sounds.   Skin:     General: Skin is warm and dry.   Neurological:      Mental Status: He is alert.   Psychiatric:          Mood and Affect: Mood normal.          LABS:  Lab Results   Component Value Date    NA 142 02/21/2022    K 4.4 02/21/2022    CL 106 02/21/2022    CO2 25 02/21/2022    BUN 18 02/21/2022    CREATININE 0.9 02/21/2022    GLUCOSE 104 (H) 02/21/2022    CALCIUM 9.6 02/21/2022    PROT 7.4 02/21/2022    LABALBU 4.4 02/21/2022    BILITOT 0.30 02/21/2022    ALKPHOS 91 02/21/2022    AST 26 02/21/2022    ALT 42 02/21/2022    LABGLOM 106 02/21/2022    GFRAA 105 02/15/2020    AGRATIO 1.00 02/21/2022    GLOB 3.0 02/21/2022       Lab Results   Component Value Date    WBC 9.0 02/21/2022    HGB 16.8 02/21/2022    HCT 49.7 02/21/2022    MCV 86.6 02/21/2022    PLT 307 02/21/2022        Lab Results   Component Value Date    CHOL 137 02/21/2022     Lab Results   Component Value Date    TRIG 184 (H) 02/21/2022     Lab Results   Component Value Date    HDL 30 (L) 02/21/2022     Lab Results   Component Value Date    LDLCHOLESTEROL 69.0  02/21/2022       The 10-year ASCVD risk score (Arnett DK, et al., 2019) is: 7.3%    Values used to calculate the score:      Age: 8 years      Sex: Male      Is Non-Hispanic African American: No      Diabetic: No      Tobacco smoker: Yes      Systolic Blood Pressure: [id number] mmHg      Is BP treated: No      HDL Cholesterol: 30 mg/dL      Total Cholesterol: 137 mg/dL     I have reviewed the above labs with the patient during today's office visit.    IMPRESSION/PLAN:  1. Adult general medical examination  2. Pure hypercholesterolemia  Assessment & Plan:  Well Controlled. No changes are planned today. Continue rosuvastatin - 10 MG    3. Acquired hypothyroidism  Assessment & Plan:  Well Controlled. No changes are planned today. Continue levothyroxine 112 mcg daily.    4. Smoker  Assessment & Plan:  Bupropion and chantix were not well tolerated.    5. Rhinitis medicamentosa  Comments:  I'll provide nasal ipratropium and recommend stopping afrin. He agreed.   Orders:  -     ipratropium (ATROVENT) 0.06 % nasal  spray; 2 sprays by Each Nostril route 4 times daily, Disp-15 mL, R-3Normal  6. Anxiety  Assessment & Plan:   Stable. No current therapy.        This patient presented for a preventive health visit today. We discussed healthy eating and physical activity. Immunizations were discussed and updated as listed above. All age and gender appropriate screening based on the recommendations of the [place] [employer] were discussed and performed as listed below.        Follow up and Dispositions:  Return in about 6 months (around 09/12/2022) for Thyroid.       Rachel Moulds, M.D.

## 2022-03-14 NOTE — Patient Instructions (Signed)
I recommend you purchase an upper arm, automated, digital blood pressure monitor. An example of an easy to use, affordable monitor is the OMRON Bronze Blood Pressure Monitor, which is available on [employer] for about $35-45.     Your BP was a little elevated today. I would like you to check your blood pressure twice daily over the next week and call me or send a MyChart message with your average blood pressure over that time. If your results are < 140/90, no changes are needed, but if either average number is elevated, we may need additional therapy.     To check your BP at home, I recommend using an automated, upper arm home BP cuff. For 3-7 consecutive days check BP twice in the morning, separated by 1 minute between checks, and twice in the  evening. Results should be recorded and averaged. Blood pressure should be checked while seated on a chair with a back with both feet on the floor (uncrossed) and with arm supported at the level of the heart (resting on a table or  counter). Caffeine should be avoided within 30 minutes of BP measurement and bladder should be emptied prior to measurement. Goal blood pressure is <140/90. Call MD for any blood pressure measurement that is >200/110 or <90/50.

## 2022-03-23 NOTE — Assessment & Plan Note (Signed)
Well Controlled. No changes are planned today. Continue levothyroxine 112 mcg daily.

## 2022-03-23 NOTE — Assessment & Plan Note (Signed)
Stable. No current therapy.

## 2022-03-23 NOTE — Assessment & Plan Note (Addendum)
Well Controlled. No changes are planned today. Continue rosuvastatin - 10 MG

## 2022-03-23 NOTE — Assessment & Plan Note (Signed)
Bupropion and chantix were not well tolerated.

## 2022-04-01 ENCOUNTER — Telehealth

## 2022-04-01 MED ORDER — LISINOPRIL 10 MG PO TABS
10 MG | ORAL_TABLET | Freq: Every day | ORAL | 3 refills | Status: DC
Start: 2022-04-01 — End: 2022-06-06

## 2022-04-01 NOTE — Telephone Encounter (Signed)
-----   Message from [place] F Freeman, [place] sent at 03/28/2022 11:09 AM EST -----  Regarding: FW: BP check.  Pt says that he did not write any down, but they were around the same as when he was in the office.   ----- Message -----  From: Jovita Gamma, MD  Sent: 03/28/2022  12:00 AM EST  To: Kenard Gower, MA  Subject: BP check.                                        Please document his home blood pressure measurements over the last 3 days. BP elevated on 03/14/22 Thanks!

## 2022-04-01 NOTE — Telephone Encounter (Signed)
Ok, then I'd like to start a low dose of a BP medication called lisinopril. The most common side effect of this medication is a dry cough. Please schedule him a follow up in 6-8w with me with nonfasting labs prior. Thanks!

## 2022-04-02 NOTE — Telephone Encounter (Signed)
Unable to reach pt. Left voicemail to call back to schedule follow up visit.

## 2022-04-03 NOTE — Telephone Encounter (Signed)
Tried to reach pt again. Left voicemail for pt to call back about medication and to scheduled a follow up visit.

## 2022-04-25 ENCOUNTER — Inpatient Hospital Stay: Admit: 2022-04-25 | Discharge: 2022-04-25 | Payer: PRIVATE HEALTH INSURANCE | Primary: Family Medicine

## 2022-04-25 DIAGNOSIS — I10 Essential (primary) hypertension: Secondary | ICD-10-CM

## 2022-04-25 LAB — BASIC METABOLIC PANEL
Anion Gap: 9 mmol/L (ref 2–17)
BUN: 19 mg/dL (ref 6–20)
CO2: 24 mmol/L (ref 22–29)
Calcium: 9.5 mg/dL (ref 8.5–10.7)
Chloride: 105 mmol/L (ref 98–107)
Creatinine: 0.9 mg/dL (ref 0.7–1.3)
Est, Glom Filt Rate: 106 mL/min/1.73m (ref >=60)
Glucose: 93 mg/dL (ref 70–99)
OSMOLALITY CALCULATED: 277 mOsm/kg (ref 270–287)
Potassium: 3.9 mmol/L (ref 3.5–5.3)
Sodium: 138 mmol/L (ref 135–145)

## 2022-05-23 ENCOUNTER — Encounter
Admit: 2022-05-23 | Discharge: 2022-05-23 | Payer: PRIVATE HEALTH INSURANCE | Attending: Family Medicine | Primary: Family Medicine

## 2022-05-23 DIAGNOSIS — I10 Essential (primary) hypertension: Secondary | ICD-10-CM

## 2022-05-23 NOTE — Progress Notes (Signed)
CHIEF COMPLAINT:  Chief Complaint   Patient presents with    Follow-up     6 weeks    Hypertension        HISTORY OF PRESENT ILLNESS:  Mr. Monteforte is a 47 y.o. male  established patient of mine who presents for a medically necessary primary care follow up visit for management of Hypertension. This is a 6 month interval follow up visit. Mr. Juarbe has previously had excellent Blood Pressure control.         He notes a dry cough, but had a recent URI, so unsure if related to lisinopril.      his most recent blood pressure measurements in the office have been.  BP Readings from Last 3 Encounters:   05/23/22 118/80   03/14/22 (!) 140/80   12/06/21 124/80        his most recent labs are listed below for review:  Lab Results   Component Value Date/Time    NA 138 04/25/2022 11:23 AM    K 3.9 04/25/2022 11:23 AM    CL 105 04/25/2022 11:23 AM    CO2 24 04/25/2022 11:23 AM    BUN 19 04/25/2022 11:23 AM    CREATININE 0.9 04/25/2022 11:23 AM    GLUCOSE 93 04/25/2022 11:23 AM    CALCIUM 9.5 04/25/2022 11:23 AM         Hypertension         Review of Systems     Physical Exam  Constitutional:       General: He is not in acute distress.     Appearance: Normal appearance.   Pulmonary:      Effort: Pulmonary effort is normal.   Neurological:      Mental Status: He is alert.   Psychiatric:         Mood and Affect: Mood normal.         Behavior: Behavior normal.         Thought Content: Thought content normal.         Judgment: Judgment normal.          ASSESSMENT/PLAN:  1. Benign essential hypertension  Assessment & Plan:  BP improved, but with slight dry cough. He would prefer to remain on lisinopril. If cough persists, consider changing to losartan.    2. Decreased libido  Comments:  I'll check testosterone and treat if needed.  Orders:  -     Testosterone; Future       Current therapy, including any changes listed above, have been reviewed with Mr. Holquin during the office visit performed 05/23/22. The most recent labs were reviewed as  listed above and labs to be performed today or in the future are ordered as listed above.     Follow up and Dispositions:  Return in about 16 weeks (around 09/12/2022) for HTN (already scheduled).     This note was created using voice recognition software and may contain typographic errors missed during final review. The intent is to have a complete and accurate medical record.  As a valued partner in this safety effort, if you have noted factual errors, please complete the Health Information Amendment/Correct Form or call the [facility] Health Information Management Office at [phone].     Hulda Marin, M.D.

## 2022-05-23 NOTE — Assessment & Plan Note (Signed)
BP improved, but with slight dry cough. He would prefer to remain on lisinopril. If cough persists, consider changing to losartan.

## 2022-06-06 ENCOUNTER — Inpatient Hospital Stay: Admit: 2022-06-06 | Discharge: 2022-06-06 | Payer: PRIVATE HEALTH INSURANCE | Primary: Family Medicine

## 2022-06-06 ENCOUNTER — Telehealth

## 2022-06-06 DIAGNOSIS — R6882 Decreased libido: Secondary | ICD-10-CM

## 2022-06-06 LAB — TESTOSTERONE: Testosterone: 287 ng/dL (ref 249.0–836.0)

## 2022-06-06 MED ORDER — VALSARTAN 160 MG PO TABS
160 | ORAL_TABLET | Freq: Every day | ORAL | 4 refills | 90.00000 days | Status: DC
Start: 2022-06-06 — End: 2023-06-23

## 2022-06-06 NOTE — Telephone Encounter (Signed)
Pt called and has noticed the cough from taking lisinipril is getting more annoying and he has changed his mind and would like to switch to a different medication

## 2022-06-06 NOTE — Telephone Encounter (Signed)
LM on VM of information listed.

## 2022-06-06 NOTE — Other (Signed)
Pt told of results and understood.

## 2022-06-06 NOTE — Addendum Note (Signed)
Addended by: Jaquita Rector on: 06/06/2022 04:55 PM     Modules accepted: Orders

## 2022-06-06 NOTE — Other (Signed)
Please call to notify Jacob Yu that his recent results show his testosterone is low normal. I'll repeat in 2 weeks to confirm. Labs ordered.

## 2022-06-06 NOTE — Telephone Encounter (Signed)
I'll substitute valsartan 160 mg daily for lisinopril. He may take at the next scheduled lisinopril dose. Have him check his BP at home after 7 days of therapy and we will call to check in. Thanks!

## 2022-06-16 NOTE — Telephone Encounter (Signed)
Pt will start tomorrow and call back with BP readings.

## 2022-06-16 NOTE — Telephone Encounter (Signed)
-----   Message from Jaquita Rector, MD sent at 06/06/2022  4:08 PM EDT -----  Please document his home blood pressure measurements over the last 3 days.  I transition from lisinopril 10 mg to valsartan 160 mg on May 10.  Thanks!

## 2022-06-20 ENCOUNTER — Inpatient Hospital Stay: Admit: 2022-06-20 | Discharge: 2022-06-20 | Payer: PRIVATE HEALTH INSURANCE | Primary: Family Medicine

## 2022-06-20 DIAGNOSIS — R6882 Decreased libido: Secondary | ICD-10-CM

## 2022-06-20 LAB — FOLLICLE STIMULATING HORMONE: FSH: 5.15 IU/mL (ref 1.50–12.40)

## 2022-06-20 LAB — LUTEINIZING HORMONE: LH: 4.07 m[IU]/mL (ref 1.14–8.75)

## 2022-06-20 LAB — TESTOSTERONE: Testosterone: 262 ng/dL (ref 249.0–836.0)

## 2022-06-20 MED ORDER — TESTOSTERONE 20.25 MG/ACT (1.62%) TD GEL
20.25 | Freq: Every day | TRANSDERMAL | 5 refills | Status: AC
Start: 2022-06-20 — End: 2022-12-17

## 2022-06-20 NOTE — Other (Signed)
Lm to call office

## 2022-06-20 NOTE — Addendum Note (Signed)
Addended by: Jaquita Rector on: 06/20/2022 04:23 PM     Modules accepted: Orders

## 2022-06-20 NOTE — Other (Signed)
Please call to notify Jacob Yu that his recent results show his testoterone level is low. I'd like to give a trial of topical testosterone supplementation.  This is to be applied to his shoulders and upper arms in the morning.  Women and children should avoid contact with the skin of his shoulders and upper arms and with laundry that touched this part of his body.  If he plans to go to the beach or pool and spend time shirtless, I would recommend not using the testosterone those days.  Before we start therapy, he will need to have a PSA drawn to screen for prostate cancer.  If this is normal, he may proceed with beginning therapy.  I will repeat labs including another screening PSA in 3 months prior to his August 16 office visit.  Thanks!

## 2022-08-14 ENCOUNTER — Encounter

## 2022-08-15 MED ORDER — ROSUVASTATIN CALCIUM 10 MG PO TABS
10 MG | ORAL_TABLET | Freq: Every day | ORAL | 3 refills | Status: AC
Start: 2022-08-15 — End: ?

## 2022-09-12 ENCOUNTER — Encounter
Admit: 2022-09-12 | Discharge: 2022-09-12 | Payer: PRIVATE HEALTH INSURANCE | Attending: Family Medicine | Primary: Family Medicine

## 2022-09-12 DIAGNOSIS — R7989 Other specified abnormal findings of blood chemistry: Secondary | ICD-10-CM

## 2022-09-12 MED ORDER — TIZANIDINE HCL 4 MG PO TABS
4 | ORAL_TABLET | Freq: Three times a day (TID) | ORAL | 0 refills | Status: AC | PRN
Start: 2022-09-12 — End: ?

## 2022-09-12 NOTE — Assessment & Plan Note (Addendum)
Improving.  Continue AndroGel 1.62% 2 pumps daily.  I will recheck a PSA and a CBC today.    Orders:    PSA, Diagnostic; Future

## 2022-09-12 NOTE — Patient Instructions (Addendum)
I'd like to repeat nonfasting labs in 3 months and will see you back in 6 months with fasting labs prior.     For imaging, a more affordable option may to be to use [facility] located in [place].  [phone]  [email or web address]  Fax [phone]    [address]  [place]. [place], [place] [address]    You may also use [facility] which has locations in [place] in a [address].    [facility]  Phone: [phone]  [address]  [place], [place] [address]    [facility]  Phone: [phone]  [address]  [place], [place] [address]

## 2022-09-12 NOTE — Addendum Note (Signed)
Addended by: Jaquita Rector on: 09/12/2022 11:44 AM     Modules accepted: Orders

## 2022-09-12 NOTE — Assessment & Plan Note (Addendum)
Well-controlled.  Continue levothyroxine 112 mcg daily.  Repeat labs in January 2025.    Orders:    TSH with Reflex; Future

## 2022-09-12 NOTE — Progress Notes (Addendum)
Chief Complaint   Patient presents with    Follow-up     6 months thyroid       Mr. Holeman is a 47 y.o. male  established patient of mine who presents for a medically necessary primary care follow up visit for management of his chronic conditions. He has some concerns regarding his chronic conditions.    ASSESSMENT/PLAN:  Assessment & Plan  Low testosterone in male    Improving.  Continue AndroGel 1.62% 2 pumps daily.  I will recheck a PSA and a CBC today.    Orders:    PSA, Diagnostic; Future    Long-term current use of testosterone replacement therapy   Well-controlled, continue current medications pending work up below    Orders:    PSA, Diagnostic; Future    CBC with Auto Differential; Future    PSA, Diagnostic; Future    CBC with Auto Differential; Future    PSA, Diagnostic; Future    Benign essential hypertension   Well Controlled. No changes are planned today.  Continue valsartan 160 mg daily    Orders:    CBC with Auto Differential; Future    CBC with Auto Differential; Future    CBC; Future    Comprehensive Metabolic Panel; Future    Acquired hypothyroidism    Well-controlled.  Continue levothyroxine 112 mcg daily.  Repeat labs in January 2025.    Orders:    TSH with Reflex; Future    Pure hypercholesterolemia    Well controlled. Continue rosuvastatin 10 mg daily.     Orders:    Lipid Panel; Future    Trapezius muscle spasm    New.  I will add a muscle relaxant to his current therapy.  For persistent symptoms, we could also consider a trigger point injection.  Return for worsening or prolonged symptoms.    Orders:    tiZANidine (ZANAFLEX) 4 MG tablet; Take 1 tablet by mouth 3 times daily as needed (neck pain)         Follow up and Dispositions:  Return in about 6 months (around 03/15/2023).     HISTORY OF PRESENT ILLNESS:  Hypothyroidism:  This is long standing diagnosis for Mr. Bemiss. His most recent thyroid labs were:   Lab Results   Component Value Date    TSH 1.600 02/21/2022      09/12/22: Mr. Ostrand has  no new concerns regarding his thyroid disease.     We recently initiated therapy with topical testosterone.  He feels that this has helped with his symptoms of fatigue.  I would like to recheck labs today.    He reports some pain on the right side of his neck over the past several weeks.  He does not recall a specific injury, but thinks he pulled a muscle.  The pain is worse with rotation of the head.  Acetaminophen and naproxen are helping along with heat and ice, but he still has persistent symptoms.  He denies any radiation of the pain down his arms.    HPI     REVIEW OF SYSTEMS:  Review of Systems    PHYSICAL EXAM:  Physical Exam  Vitals reviewed.   Constitutional:       General: He is not in acute distress.  Pulmonary:      Effort: Pulmonary effort is normal.   Psychiatric:         Mood and Affect: Mood normal.         This note was created using  voice recognition software and may contain typographic errors missed during final review. The intent is to have a complete and accurate medical record.  As a valued partner in this safety effort, if you have noted factual errors, please complete the Health Information Amendment/Correct Form or call the [facility] Health Information Management Office at [phone].       Hulda Marin, M.D.    An electronic signature was used to authenticate this note.

## 2022-09-12 NOTE — Assessment & Plan Note (Addendum)
Well controlled. Continue rosuvastatin 10 mg daily.     Orders:    Lipid Panel; Future

## 2022-09-12 NOTE — Assessment & Plan Note (Addendum)
Well Controlled. No changes are planned today.  Continue valsartan 160 mg daily    Orders:    CBC with Auto Differential; Future    CBC with Auto Differential; Future    CBC; Future    Comprehensive Metabolic Panel; Future

## 2022-10-17 ENCOUNTER — Encounter

## 2022-10-17 MED ORDER — LEVOTHYROXINE SODIUM 112 MCG PO TABS
112 | ORAL_TABLET | ORAL | 3 refills | Status: DC
Start: 2022-10-17 — End: 2023-10-05

## 2022-11-23 ENCOUNTER — Ambulatory Visit: Admit: 2022-11-23 | Discharge: 2022-11-23 | Payer: PRIVATE HEALTH INSURANCE | Primary: Family Medicine

## 2022-11-23 ENCOUNTER — Ambulatory Visit
Admit: 2022-11-23 | Discharge: 2022-11-23 | Payer: PRIVATE HEALTH INSURANCE | Attending: Family Medicine | Primary: Family Medicine

## 2022-11-23 DIAGNOSIS — M549 Dorsalgia, unspecified: Secondary | ICD-10-CM

## 2022-11-23 MED ORDER — KETOROLAC TROMETHAMINE 60 MG/2ML IM SOLN
60 | Freq: Once | INTRAMUSCULAR | Status: AC
Start: 2022-11-23 — End: 2022-11-23
  Administered 2022-11-23: 22:00:00 60 mg via INTRAMUSCULAR

## 2022-11-23 MED ORDER — NABUMETONE 750 MG PO TABS
750 | ORAL_TABLET | Freq: Two times a day (BID) | ORAL | 0 refills | Status: AC
Start: 2022-11-23 — End: 2022-12-03

## 2022-11-23 MED ORDER — CYCLOBENZAPRINE HCL 10 MG PO TABS
10 | ORAL_TABLET | Freq: Every evening | ORAL | 0 refills | Status: AC | PRN
Start: 2022-11-23 — End: 2022-12-03

## 2022-11-23 NOTE — Progress Notes (Signed)
Jacob Yu (DOB:  1975/12/16) is a 47 y.o. male, presents for evaluation of the following chief complaint(s):  Back Pain (PT states Thursday he was laying down and when he went to get back up he felt a pain in the middle of his back on the right side. PT states the pain has gotten worse with movement. If he goes to stand and sit down he feels pain. He is also experiencing the pain when he takes a deep breathe or coughs. )        Subjective   SUBJECTIVE/OBJECTIVE:  Patient with exquisite pain over the right midback for three days. Thinks he may have injured four days ago. Awoke the next morning with worsening pain. Pain is at its worst on deep breathing       BP 122/82   Pulse 80   Temp 98.4 F (36.9 C) (Oral)   Resp 19   Ht 1.651 m (5\' 5" )   Wt 89.5 kg (197 lb 6.4 oz)   SpO2 98%   BMI 32.85 kg/m      Allergies   Allergen Reactions    Lisinopril Cough    Cat Hair Extract     No Known Allergies     Other      Grass Mix Pollens Allergen Ext    Seasonal      Other reaction(s): ALLERGIC RHINITIS--SEVERITY VARIES    Other Reaction(s): ALLERGIC RHINITIS--SEVERITY VARIES      Current Outpatient Medications   Medication Sig Dispense Refill    nabumetone (RELAFEN) 750 MG tablet Take 1 tablet by mouth 2 times daily for 10 days 20 tablet 0    cyclobenzaprine (FLEXERIL) 10 MG tablet Take 1 tablet by mouth nightly as needed for Muscle spasms 10 tablet 0    levothyroxine (SYNTHROID) 112 MCG tablet TAKE 1 TABLET ONCE DAILY IN              THE MORNING ON AN EMPTY             STOMACH. DOSAGE REDUCTION,               CANCEL SYNTHROID TAB 0.125MCG 90 tablet 3    tiZANidine (ZANAFLEX) 4 MG tablet Take 1 tablet by mouth 3 times daily as needed (neck pain) 30 tablet 0    rosuvastatin (CRESTOR) 10 MG tablet TAKE 1 TABLET DAILY 90 tablet 3    Testosterone (ANDROGEL) 20.25 MG/ACT (1.62%) GEL gel Place 2 actuation onto the skin daily for 180 days. Apply to shoulder and upper arm Max Daily Amount: 2,500 mg 75 g 5     valsartan (DIOVAN) 160 MG tablet Take 1 tablet by mouth daily 90 tablet 4    cetirizine (ZYRTEC) 10 MG tablet Take 1 tablet by mouth      ipratropium (ATROVENT) 0.06 % nasal spray 2 sprays by Each Nostril route 4 times daily 15 mL 3    fluticasone (FLONASE) 50 MCG/ACT nasal spray   2 sprays, Nasal, BID, # 16 g, 0 Refill(s)      celecoxib (CELEBREX) 200 MG capsule Take 1 capsule by mouth as needed      levocetirizine (XYZAL) 5 MG tablet Take 1 tablet by mouth In the evening once a day      Azelastine-Fluticasone 137-50 MCG/ACT SUSP 1 spray in each nostril Nasally Twice a day for 30 day(s)      cyclobenzaprine (FLEXERIL) 10 MG tablet Take 1 tablet by mouth 3 times daily as needed for Muscle spasms (  Patient not taking: Reported on 11/23/2022) 30 tablet 0    ibuprofen (ADVIL;MOTRIN) 800 MG tablet Take by mouth 1 tablet as needed for pain Take with food every 8 hours (Patient not taking: Reported on 11/23/2022)       No current facility-administered medications for this visit.      Past Medical History:   Diagnosis Date    Allergic sinusitis     Back     Closed fracture of phalanx of left second toe 08/08/2020    Hypothyroidism       Past Surgical History:   Procedure Laterality Date    KIDNEY STONE SURGERY      OTHER SURGICAL HISTORY Right     labrum      Family History   Problem Relation Age of Onset    Hypertension Mother     Thyroid Disease Mother       Social History     Occupational History    Not on file   Tobacco Use    Smoking status: Some Days     Types: Cigarettes     Start date: 01/27/1994    Smokeless tobacco: Current    Tobacco comments:     03/09/2020   Vaping Use    Vaping status: Never Used   Substance and Sexual Activity    Alcohol use: Never    Drug use: Never    Sexual activity: Not on file          Review of Systems  A patient appropriate review of systems is negative except for those systems mentioned in the HPI.         Objective   Physical Exam            ASSESSMENT/PLAN:  1. Mid back pain on right  side  -     XR RIBS RIGHT INCLUDE CHEST (MIN 3 VIEWS) (CLINIC PERFORMED)    X-ray shows no fracture, dislocation or other acute issue. Patient has strain of   lower trapezius or latissimus dorsi right side. At this time we will refer to physical therapy.  We also counseled patient to ice the area of greatest pain for 30 minutes twice daily.  We will prescribe a course of Nabumetone as well as MuscleRelaxer: Cyclobenzaprine to be taken in the evening.  We explained this medication will make patient drowsy and patient should not engage in potentially hazardous activities while under its influence.  Patient expresses understanding and agreement with our plan of care.      -     ketorolac (TORADOL) injection 60 mg; 60 mg, IntraMUSCular, ONCE, 1 dose, On Sun 11/23/22 at 1800  -     nabumetone (RELAFEN) 750 MG tablet; Take 1 tablet by mouth 2 times daily for 10 days, Disp-20 tablet, R-0 Normal  -     [facility]    2. Muscle spasm of back  -     nabumetone (RELAFEN) 750 MG tablet; Take 1 tablet by mouth 2 times daily for 10 days, Disp-20 tablet, R-0 Normal  -     cyclobenzaprine (FLEXERIL) 10 MG tablet; Take 1 tablet by mouth nightly as needed for Muscle spasms, Disp-10 tablet, R-0 Normal  -     [facility]    Results for orders placed or performed in visit on 11/23/22   XR RIBS RIGHT INCLUDE CHEST (MIN 3 VIEWS) (CLINIC PERFORMED)    Narrative    XR RIBS RIGHT INCLUDE CHEST (  MIN 3 VIEWS) 11/23/22    History: C3 -- Patient with exquisite pain over the right midback for three   days. Thinks he may have injured four days ago. Awoke the next morning with   worsening pain. Pain is at its worst on deep breathing  M54.9,Dorsalgia,   unspecified,ICD-10-CM    Comparison: Chest x-ray, 01/19/2019    Findings: A PA view the chest demonstrates no active lung disease. Normal   cardiac size. No pneumothorax. 4 additional views of the right ribs demonstrate   no rib fracture or deformity. Visualized thoracic  spine appears unremarkable.      Impression    Impression: No active lung disease. No evidence of displaced right rib fracture.   No thoracic compression fracture on the available images.        Return if symptoms worsen or fail to improve.           An electronic signature was used to authenticate this note.      --Kenton Kingfisher, MD

## 2022-11-24 ENCOUNTER — Encounter

## 2022-12-09 ENCOUNTER — Inpatient Hospital Stay: Admit: 2022-12-09 | Payer: PRIVATE HEALTH INSURANCE | Attending: Anesthesiology | Primary: Family Medicine

## 2022-12-09 ENCOUNTER — Ambulatory Visit: Payer: PRIVATE HEALTH INSURANCE | Primary: Family Medicine

## 2022-12-09 DIAGNOSIS — M62830 Muscle spasm of back: Secondary | ICD-10-CM

## 2022-12-09 MED ORDER — PROMETHAZINE HCL 25 MG/ML IJ SOLN
25 | Freq: Once | INTRAMUSCULAR | Status: AC
Start: 2022-12-09 — End: 2022-12-09
  Administered 2022-12-09: 14:00:00 12.5 mg via INTRAMUSCULAR

## 2022-12-09 MED ORDER — PROMETHAZINE HCL 25 MG/ML IJ SOLN
25 | INTRAMUSCULAR | Status: AC
Start: 2022-12-09 — End: ?

## 2022-12-09 MED ORDER — MEPERIDINE HCL 50 MG/ML IJ SOLN
50 | INTRAMUSCULAR | Status: AC
Start: 2022-12-09 — End: ?

## 2022-12-09 MED ORDER — DIAZEPAM 5 MG PO TABS
5 | ORAL | Status: AC
Start: 2022-12-09 — End: ?

## 2022-12-09 MED ORDER — DIAZEPAM 5 MG PO TABS
5 | Freq: Once | ORAL | Status: AC
Start: 2022-12-09 — End: 2022-12-09
  Administered 2022-12-09: 14:00:00 10 mg via ORAL

## 2022-12-09 MED ORDER — MEPERIDINE HCL 25 MG/ML IJ SOLN
25 | Freq: Once | INTRAMUSCULAR | Status: AC
Start: 2022-12-09 — End: 2022-12-09
  Administered 2022-12-09: 14:00:00 25 mg via INTRAMUSCULAR

## 2022-12-09 MED FILL — MEPERIDINE HCL 50 MG/ML IJ SOLN: 50 MG/ML | INTRAMUSCULAR | Qty: 1

## 2022-12-09 MED FILL — PROMETHAZINE HCL 25 MG/ML IJ SOLN: 25 MG/ML | INTRAMUSCULAR | Qty: 1

## 2022-12-09 MED FILL — DIAZEPAM 5 MG PO TABS: 5 MG | ORAL | Qty: 2

## 2022-12-09 MED FILL — DEMEROL 25 MG/ML IJ SOLN: 25 MG/ML | INTRAMUSCULAR | Qty: 1

## 2022-12-09 NOTE — Progress Notes (Signed)
 Pt seen for scheduled thoracic and lumbar MRI with sedation. Name and DOB verified. Allergies reviewed. Pt requires sedation for pain. Order obtained for versed 10 mg PO, demerol 25 mg IM and promethazine 12.5 mg IM. Tolerated well. Family will transport t

## 2022-12-11 ENCOUNTER — Ambulatory Visit: Payer: PRIVATE HEALTH INSURANCE | Primary: Family Medicine

## 2022-12-18 ENCOUNTER — Encounter: Admit: 2022-12-18 | Admitting: Anesthesiology

## 2022-12-18 DIAGNOSIS — M62830 Muscle spasm of back: Secondary | ICD-10-CM

## 2022-12-19 ENCOUNTER — Encounter: Admit: 2022-12-19 | Admitting: Family Medicine

## 2022-12-19 ENCOUNTER — Other Ambulatory Visit
Admit: 2022-12-19 | Discharge: 2022-12-20 | Disposition: A | Discharge status: Home / Self Care | Payer: PRIVATE HEALTH INSURANCE | Source: Ambulatory Visit | Attending: Family Medicine | Admitting: Family Medicine | Primary: Family Medicine

## 2022-12-19 DIAGNOSIS — I10 Essential (primary) hypertension: Secondary | ICD-10-CM

## 2022-12-19 DIAGNOSIS — Z79890 Hormone replacement therapy (postmenopausal): Secondary | ICD-10-CM

## 2022-12-19 LAB — CBC WITH AUTO DIFFERENTIAL
Basophils %: 0.5 % (ref 0.0–2.0)
Basophils Absolute: 0 10*3/uL (ref 0.0–0.2)
Eosinophils %: 4.3 % (ref 0.0–7.0)
Eosinophils Absolute: 0.3 10*3/uL (ref 0.0–0.5)
Hematocrit: 47.3 % (ref 38.0–52.0)
Hemoglobin: 16.4 g/dL (ref 13.0–17.3)
Immature Grans (Abs): 0.01 10*3/uL (ref 0.00–0.06)
Lymphocytes: 30.1 % (ref 15.0–45.0)
MCH: 29.2 pg (ref 27.0–34.5)
MCHC: 34.7 g/dL (ref 30.0–36.0)
MCV: 84.2 fL (ref 84.0–100.0)
MPV: 8.9 fL (ref 7.0–12.2)
Monocytes %: 10 % (ref 4.0–12.0)
Monocytes Absolute: 0.8 10*3/uL (ref 0.3–1.0)
Neutrophils %: 55 % (ref 42.0–74.0)
Neutrophils Absolute: 4.3 10*3/uL (ref 1.6–7.3)
Platelets: 271 10*3/uL (ref 140–440)
RBC: 5.62 x10e6/mcL — ABNORMAL HIGH (ref 4.00–5.60)
RDW: 13 % (ref 10.0–17.0)
WBC: 7.7 10*3/uL (ref 3.8–10.6)

## 2022-12-19 LAB — PSA, DIAGNOSTIC: PSA: 3.42 ng/mL (ref 0.000–4.000)

## 2022-12-19 NOTE — Other (Signed)
 Spoke to pt, he was told of results.

## 2023-03-03 ENCOUNTER — Ambulatory Visit
Admit: 2023-03-03 | Discharge: 2023-03-03 | Payer: PRIVATE HEALTH INSURANCE | Attending: Physician Assistant | Primary: Family Medicine

## 2023-03-03 VITALS — BP 126/82 | HR 86 | Temp 97.00000°F | Resp 16 | Ht 65.0 in | Wt 199.0 lb

## 2023-03-03 DIAGNOSIS — J0110 Acute frontal sinusitis, unspecified: Secondary | ICD-10-CM

## 2023-03-03 MED ORDER — AMOXICILLIN-POT CLAVULANATE 875-125 MG PO TABS
875-125 | ORAL_TABLET | Freq: Two times a day (BID) | ORAL | 0 refills | Status: AC
Start: 2023-03-03 — End: 2023-03-10

## 2023-03-03 NOTE — Progress Notes (Signed)
CHIEF COMPLAINT:  Chief Complaint   Patient presents with    Nasal Congestion     Drainage, started Saturday, runny nose, inside of nose feels raw, sneezing, both eyes are watery, thinks it might be a sinus infection, denies cough, fever, sore throat.     Headache     Sinus pressure, dull ache that comes blowing nose.        HISTORY OF PRESENT ILLNESS:    History of Present Illness  The patient presents for evaluation of sinus congestion.    He began experiencing symptoms approximately 5 days ago, which he suspects may be due to a sinus infection. His symptoms include sinus congestion, rhinorrhea, and ocular pruritus and drainage, which he attributes to his known seasonal allergies. Despite receiving allergy injections every 4 weeks, he experiences intermittent nasal stuffiness and subsequent relief. He has not experienced any fevers or chills but reports feeling slightly fatigued and under pressure. He has been using Sinex or Afrin unilaterally for an extended period, initially discontinuing it post-septoplasty, but resuming its use due to sleep disturbances caused by nasal obstruction. He now requires nightly use of the spray to facilitate sleep, with each application providing approximately 4 hours of relief. He has tried Flonase and Astelin, which provide temporary relief but result in increased nasal discharge. He works indoors in a factory and does not report any cough, dyspnea, or sore throat. He is currently taking Xyzal, Astelin, and Flonase as needed for his symptoms. He reports crusting around his eyes upon waking, which has improved since the onset of his symptoms. He has a history of environmental allergies and gets allergy shots every 4 weeks.He is considering requesting a turbinate reduction from his ENT specialist, as this procedure was beneficial following his septoplasty.    MEDICATIONS  Current: Xyzal, Astelin, Flonase, Afrin      REVIEW OF SYSTEMS:  Review of systems is as indicated in HPI  otherwise negative.    PHQ:      03/03/2023     3:23 PM   PHQ-9    Little interest or pleasure in doing things 0   Feeling down, depressed, or hopeless 0   PHQ-2 Score 0   PHQ-9 Total Score 0       CURRENT MEDICATION LIST:    Current Outpatient Medications   Medication Sig Dispense Refill    levothyroxine (SYNTHROID) 112 MCG tablet TAKE 1 TABLET ONCE DAILY IN              THE MORNING ON AN EMPTY             STOMACH. DOSAGE REDUCTION,               CANCEL SYNTHROID TAB 0.125MCG 90 tablet 3    tiZANidine (ZANAFLEX) 4 MG tablet Take 1 tablet by mouth 3 times daily as needed (neck pain) 30 tablet 0    rosuvastatin (CRESTOR) 10 MG tablet TAKE 1 TABLET DAILY 90 tablet 3    valsartan (DIOVAN) 160 MG tablet Take 1 tablet by mouth daily 90 tablet 4    cetirizine (ZYRTEC) 10 MG tablet Take 1 tablet by mouth      ipratropium (ATROVENT) 0.06 % nasal spray 2 sprays by Each Nostril route 4 times daily 15 mL 3    fluticasone (FLONASE) 50 MCG/ACT nasal spray   2 sprays, Nasal, BID, # 16 g, 0 Refill(s)      celecoxib (CELEBREX) 200 MG capsule Take 1 capsule by mouth as needed  levocetirizine (XYZAL) 5 MG tablet Take 1 tablet by mouth In the evening once a day      Azelastine-Fluticasone 137-50 MCG/ACT SUSP 1 spray in each nostril Nasally Twice a day for 30 day(s)      nabumetone (RELAFEN) 750 MG tablet Take 1 tablet by mouth 2 times daily for 10 days 20 tablet 0    Testosterone (ANDROGEL) 20.25 MG/ACT (1.62%) GEL gel Place 2 actuation onto the skin daily for 180 days. Apply to shoulder and upper arm Max Daily Amount: 2,500 mg 75 g 5    cyclobenzaprine (FLEXERIL) 10 MG tablet Take 1 tablet by mouth 3 times daily as needed for Muscle spasms (Patient not taking: Reported on 11/23/2022) 30 tablet 0    ibuprofen (ADVIL;MOTRIN) 800 MG tablet Take by mouth 1 tablet as needed for pain Take with food every 8 hours (Patient not taking: Reported on 11/23/2022)       No current facility-administered medications for this visit.         ALLERGIES:    Allergies   Allergen Reactions    Lisinopril Cough    Cat Dander     No Known Allergies     Other      Grass Mix Pollens Allergen Ext    Seasonal      Other reaction(s): ALLERGIC RHINITIS--SEVERITY VARIES    Other Reaction(s): ALLERGIC RHINITIS--SEVERITY VARIES        HISTORY:  Past Medical History:   Diagnosis Date    Allergic sinusitis     Back     Closed fracture of phalanx of left second toe 08/08/2020    Hypothyroidism       Past Surgical History:   Procedure Laterality Date    KIDNEY STONE SURGERY      OTHER SURGICAL HISTORY Right     labrum      Social History     Socioeconomic History    Marital status: Single     Spouse name: Not on file    Number of children: Not on file    Years of education: Not on file    Highest education level: Not on file   Occupational History    Not on file   Tobacco Use    Smoking status: Some Days     Types: Cigarettes     Start date: 01/27/1994    Smokeless tobacco: Current    Tobacco comments:     03/09/2020   Vaping Use    Vaping status: Never Used   Substance and Sexual Activity    Alcohol use: Never    Drug use: Never    Sexual activity: Not on file   Other Topics Concern    Not on file   Social History Narrative    Not on file     Social Determinants of Health     Financial Resource Strain: Not on file   Food Insecurity: Not on file   Transportation Needs: Not on file   Physical Activity: Not on file   Stress: Not on file   Social Connections: Not on file   Intimate Partner Violence: Not on file   Housing Stability: Not on file      Family History   Problem Relation Age of Onset    Hypertension Mother     Thyroid Disease Mother         PHYSICAL EXAM:  Vital Signs -       03/03/2023     3:08 PM 12/09/2022  10:25 AM 12/09/2022     9:09 AM 12/09/2022     9:04 AM 11/23/2022     4:43 PM   Vitals   SYSTOLIC 126 126  147 122   DIASTOLIC 82 97  109 82   Site Left Upper Arm       Position Sitting       Cuff Size Large Adult       Pulse 86 84  81 80   Temp 97 F (36.1  C)    98.4 F (36.9 C)   Respirations 16 14  22 19    SpO2 98 % 99 %  100 % 98 %   Weight - Scale 199 lb    197 lb 6.4 oz   Height 5\' 5"     5\' 5"    Body Mass Index 33.12 kg/m2    32.85 kg/m2   Pain Level  3 7 7          Physical Exam  Constitutional:       Appearance: Normal appearance.   HENT:      Head: Normocephalic and atraumatic.      Right Ear: Ear canal normal. A middle ear effusion is present.      Left Ear: Ear canal normal. A middle ear effusion is present.      Nose: Nose normal.      Mouth/Throat:      Mouth: Mucous membranes are moist.   Eyes:      Extraocular Movements: Extraocular movements intact.      Conjunctiva/sclera: Conjunctivae normal.      Pupils: Pupils are equal, round, and reactive to light.   Cardiovascular:      Rate and Rhythm: Normal rate and regular rhythm.      Pulses: Normal pulses.      Heart sounds: Normal heart sounds.   Pulmonary:      Effort: Pulmonary effort is normal.      Breath sounds: Normal breath sounds.   Abdominal:      General: Abdomen is flat. Bowel sounds are normal.      Palpations: Abdomen is soft.   Musculoskeletal:         General: Normal range of motion.      Cervical back: Normal range of motion and neck supple.   Skin:     General: Skin is warm.   Neurological:      General: No focal deficit present.      Mental Status: He is alert and oriented to person, place, and time.   Psychiatric:         Mood and Affect: Mood normal.         Behavior: Behavior normal.         LABS  No results found for this visit on 03/03/23.  Hospital Outpatient Visit on 12/19/2022   Component Date Value Ref Range Status    PSA 12/19/2022 3.420  0.000 - 4.000 ng/mL Final    Comment: PSA INTERPRETATION:    At this time, no major scientific/medical organization including the American  Cancer Society and the American Urological Association have specific  recommendations in regard to prostate specific antigen (PSA) testing other  than recommending that men at age 53 and above and those  who are at high risk  at age 19-45 and above be counseled in regard to the pros and cons of PSA  testing.    Guidelines for risk stratification    1.  Below 1:   Very low  risk of prostate cancer  2.  1-4:        Approximately 15% risk of prostate cancer  3.  4-10:       25% risk of prostate cancer  4.  >10:        50% risk of prostate cancer    The use of PSA velocity (rate of rise of PSA over time) may also be useful in  predicting the risk of prostate cancer.  Most authorities recommend PSA  testing on at least three occasions over a period of 18 months to get an  accurate PSA velocity.    PSA measured by Roche [employer] electrochemiluminescence immunoassay "ECLIA"  methodology.  PSA valu                           es determined on patient samples by different testing procedures  cannot be directly compared with one another and could be the cause of  erroneous medical interpretations.    References available by request.         I have reviewed these labs with pt at today's visit    IMPRESSION/PLAN  Assessment & Plan  Acute non-recurrent frontal sinusitis          Symptoms suggest a combination of allergic and viral etiology. The presence of watery, itchy eyes indicates an allergic response.  Explained atrue bacterial sinus infection typically presents with symptoms persisting for 7 to 10 days. An antibiotic prescription will be provided, which can be initiated if there is no improvement in symptoms at that time.  He is advised to continue using Flonase and Astelin daily and to gradually discontinue the use of Afrin. If symptoms persist, a follow-up consultation with an allergist or ENT specialist is recommended.  Also discussed chronic use of afrin is contraindicated and causing rhinitis medicamentosa and should stop.  Rhinitis medicamentosa                 Follow up and Dispositions:  No follow-ups on file.     Reviewed medications with patient today. Discussed medication use and possible side effects.   Patient given  time to ask questions. Answered all questions.  Instructed today to call/return to clinic if there is any change in health status, new symptoms or worsening symptoms. If emergent, advised to go directly to the emergency department.      Marcille Blanco, PA-C    The patient (or guardian, if applicable) and other individuals in attendance with the patient were advised that Artificial Intelligence will be utilized during this visit to record, process the conversation to generate a clinical note, and support improvement of the AI technology. The patient (or guardian, if applicable) and other individuals in attendance at the appointment consented to the use of AI, including the recording.

## 2023-03-20 ENCOUNTER — Encounter: Attending: Family Medicine | Primary: Family Medicine

## 2023-03-21 ENCOUNTER — Inpatient Hospital Stay: Admit: 2023-03-21 | Discharge: 2023-03-21 | Payer: PRIVATE HEALTH INSURANCE | Primary: Family Medicine

## 2023-03-21 DIAGNOSIS — R7989 Other specified abnormal findings of blood chemistry: Secondary | ICD-10-CM

## 2023-03-21 LAB — CBC
Hematocrit: 48 % (ref 38.0–52.0)
Hemoglobin: 16.4 g/dL (ref 13.0–17.3)
MCH: 29 pg (ref 27.0–34.5)
MCHC: 34.2 g/dL (ref 30.0–36.0)
MCV: 85 fL (ref 84.0–100.0)
MPV: 9.2 fL (ref 7.0–12.2)
Platelets: 273 10*3/uL (ref 140–440)
RBC: 5.65 x10e6/mcL — ABNORMAL HIGH (ref 4.00–5.60)
RDW: 12.8 % (ref 10.0–17.0)
WBC: 8.3 10*3/uL (ref 3.8–10.6)

## 2023-03-21 LAB — COMPREHENSIVE METABOLIC PANEL
ALT: 32 U/L (ref 0–42)
AST: 23 U/L (ref 0–46)
Albumin/Globulin Ratio: 1.6 (ref 1.00–2.70)
Albumin: 4.2 g/dL (ref 3.5–5.2)
Alk Phosphatase: 74 U/L (ref 40–130)
Anion Gap: 9 mmol/L (ref 2–17)
BUN: 20 mg/dL (ref 6–20)
CO2: 24 mmol/L (ref 22–29)
Calcium: 9.1 mg/dL (ref 8.5–10.7)
Chloride: 108 mmol/L — ABNORMAL HIGH (ref 98–107)
Creatinine: 1.1 mg/dL (ref 0.7–1.3)
Est, Glom Filt Rate: 83 mL/min/1.73mÂ² (ref >=60)
Globulin: 2.6 g/dL (ref 1.9–4.4)
Glucose: 92 mg/dL (ref 70–99)
Potassium: 4.3 mmol/L (ref 3.5–5.3)
Sodium: 141 mmol/L (ref 135–145)
Total Bilirubin: 0.41 mg/dL (ref 0.00–1.20)
Total Protein: 6.8 g/dL (ref 5.7–8.3)

## 2023-03-21 LAB — LIPID PANEL
Chol/HDL Ratio: 4 (ref 0.0–4.4)
Cholesterol, Total: 104 mg/dL (ref 100–200)
HDL: 26 mg/dL — ABNORMAL LOW (ref >=40)
LDL Cholesterol: 47 mg/dL (ref 0.0–100.0)
LDL/HDL Ratio: 1.8
Triglycerides: 155 mg/dL — ABNORMAL HIGH (ref 0–149)
VLDL: 31 mg/dL (ref 5.0–40.0)

## 2023-03-21 LAB — TSH REFLEX TO FT4: TSH: 2.64 u[IU]/mL (ref 0.358–3.740)

## 2023-03-23 LAB — PSA, DIAGNOSTIC: PSA: 1.93 ng/mL (ref 0.000–4.000)

## 2023-04-03 ENCOUNTER — Ambulatory Visit
Admit: 2023-04-03 | Discharge: 2023-04-03 | Payer: PRIVATE HEALTH INSURANCE | Attending: Family Medicine | Primary: Family Medicine

## 2023-04-03 VITALS — BP 120/82 | HR 89 | Wt 198.0 lb

## 2023-04-03 DIAGNOSIS — I10 Essential (primary) hypertension: Secondary | ICD-10-CM

## 2023-04-03 MED ORDER — ROSUVASTATIN CALCIUM 10 MG PO TABS
10 | ORAL_TABLET | ORAL | 3 refills | Status: AC
Start: 2023-04-03 — End: ?

## 2023-04-03 MED ORDER — CELECOXIB 200 MG PO CAPS
200 | ORAL_CAPSULE | Freq: Every day | ORAL | 4 refills | Status: AC
Start: 2023-04-03 — End: ?

## 2023-04-03 NOTE — Progress Notes (Signed)
 CHIEF COMPLAINT:  Chief Complaint   Patient presents with    Follow-up     6 months      History of Present Illness  47 year old male presents for 67-month follow-up.    Discontinued testosterone therapy, reduced soda and fried foods. Goals: weight loss, discontinue cholesterol and blood pressure meds. Taking multivitamins, K2, D3, DAKE, and vitamin 101. Increased physical activity and exercise. Interested in dietary alternatives for thyroid needs.    Experiencing back pain, underwent rhizotomy. Needs Celebrex refill. Pain returning, affecting exercise.    Morning bowel movements with occasional clear anal discharge noticed 1-2 months ago. No stool or leakage on underwear. Significant dietary changes, no OTC meds like Alli.    Takes thyroid medication every morning, waits 30 minutes before other meds (blood pressure, cholesterol, Xyzal). Takes multivitamins, K2, D3 at lunch. Avoids food for 30 minutes after thyroid medication.    MEDICATIONS  - Valsartan  - Rosuvastatin  - Thyroid medication  - Xyzal  - Multivitamins  - K2  - D3  - Celebrex  his most recent blood pressure measurements in the office have been.  BP Readings from Last 3 Encounters:   04/03/23 120/82   03/03/23 126/82   12/09/22 (!) 126/97      Vitals:    04/03/23 1126   BP: 120/82   Site: Left Upper Arm   Position: Sitting   Pulse: 89   SpO2: 96%   Weight: 89.8 kg (198 lb)     Physical Exam  Vitals reviewed.   Constitutional:       General: He is not in acute distress.  Cardiovascular:      Rate and Rhythm: Normal rate and regular rhythm.      Pulses: Normal pulses.      Heart sounds: Normal heart sounds.   Pulmonary:      Effort: Pulmonary effort is normal.      Breath sounds: Normal breath sounds.   Abdominal:      General: Abdomen is flat. Bowel sounds are normal.      Palpations: Abdomen is soft.   Skin:     General: Skin is warm and dry.   Psychiatric:         Mood and Affect: Mood normal.         Lab Results   Component Value Date    HGB 16.4  03/21/2023    CHOL 104 03/21/2023    LDL 47.0 03/21/2023    HDL 26 (L) 03/21/2023    TRIG 155 (H) 03/21/2023    LABGLOM 83 03/21/2023    TSH 2.640 03/21/2023    PSA 1.930 03/21/2023     The ASCVD Risk score (Arnett DK, et al., 2019) failed to calculate for the following reasons:    The valid total cholesterol range is 130 to 320 mg/dL   Results  - Laboratory Studies:    - LDL cholesterol: 47    - Kidney function: Normal    - Hemoglobin: Normal    Assessment & Plan  Hypertension:  - Continue valsartan 160 mg  - Discontinue after 10 lbs weight loss  - Monitor BP and report readings    Hypercholesterolemia:  - LDL: 47  - Reduce rosuvastatin to half pill daily or one every other day  - Consider stopping if BP meds discontinued and weight stable  - Reassess cholesterol in a month    Back pain:  - Prescribed Celebrex 200 mg, 90 tablets, 4 refills via [facility]  Clear anal discharge:  - Discontinue all supplements, reintroduce individually  - Check diet for fat substitutes like Olestra  - Maintain healthy diet, avoid processed foods, limit carbs, choose local ingredient restaurants    Hypothyroidism:  - Wait 30 minutes after thyroid medication before other meds  - Maintain consistent routine with meds and supplements    PROCEDURE  The patient has undergone rhizotomy for back pain.  Acquired hypothyroidism  Pure hypercholesterolemia  -     rosuvastatin (CRESTOR) 10 MG tablet; Take 1 tablet by mouth every other day, Disp-45 tablet, R-3Adjust Sig  Lumbar radiculopathy  -     celecoxib (CELEBREX) 200 MG capsule; Take 1 capsule by mouth daily, Disp-90 capsule, R-4Normal  Anal discharge  Benign essential hypertension      Current therapy, including any changes listed above, have been reviewed with Mr. Higinbotham during the office visit performed 04/03/23. The most recent labs were reviewed as listed above and labs to be performed today or in the future are ordered as listed above.     The patient (or guardian, if applicable) and  other individuals in attendance with the patient were advised that Artificial Intelligence will be utilized during this visit to record, process the conversation to generate a clinical note, and support improvement of the AI technology. The patient (or guardian, if applicable) and other individuals in attendance at the appointment consented to the use of AI, including the recording.        Hulda Marin, M.D.    An electronic signature was used to authenticate this note.

## 2023-06-19 ENCOUNTER — Encounter

## 2023-06-23 MED ORDER — VALSARTAN 160 MG PO TABS
160 | ORAL_TABLET | Freq: Every day | ORAL | 4 refills | 90.00000 days | Status: DC
Start: 2023-06-23 — End: 2023-10-09

## 2023-10-05 ENCOUNTER — Encounter

## 2023-10-05 MED ORDER — LEVOTHYROXINE SODIUM 112 MCG PO TABS
112 | ORAL_TABLET | ORAL | 3 refills | Status: AC
Start: 2023-10-05 — End: ?

## 2023-10-09 ENCOUNTER — Ambulatory Visit
Admit: 2023-10-09 | Discharge: 2023-10-09 | Payer: PRIVATE HEALTH INSURANCE | Attending: Family Medicine | Primary: Family Medicine

## 2023-10-09 VITALS — BP 120/76 | HR 74 | Wt 191.0 lb

## 2023-10-09 DIAGNOSIS — K649 Unspecified hemorrhoids: Principal | ICD-10-CM

## 2023-10-09 MED ORDER — VALSARTAN 160 MG PO TABS
160 | ORAL_TABLET | Freq: Every day | ORAL | 4 refills | Status: AC
Start: 2023-10-09 — End: ?

## 2023-10-09 MED ORDER — INFLUENZA VAC SPLIT QUAD 0.5 ML IM SUSY
0.5 | Freq: Once | INTRAMUSCULAR | 0 refills | Status: AC
Start: 2023-10-09 — End: 2023-10-09

## 2023-10-09 MED ORDER — COVID-19 MRNA VAC-TRIS(PFIZER) 30 MCG/0.3ML IM SUSP
30 | Freq: Once | INTRAMUSCULAR | 0 refills | Status: AC
Start: 2023-10-09 — End: 2023-10-09

## 2023-10-09 NOTE — Progress Notes (Signed)
 CHIEF COMPLAINT:  Chief Complaint   Patient presents with    Follow-up      History of Present Illness  48 year old male presents for follow-up.    Reports internal hemorrhoids causing minor bleeding and discomfort. Dietary changes include reducing soda intake to one or two Coke Zeros per day and eliminating Dr. Nunzio, resulting in a 10-pound weight loss. Walks 3 to 7 miles daily.    Takes blood pressure and cholesterol medications regularly but does not monitor blood pressure at home.    Occasional smoker, plans to quit after the weekend. Previous attempts to quit using nicotine  replacement patches, Wellbutrin , and Chantix were unsuccessful due to vivid dreams.    Family history of Hashimoto's thyroiditis; interested in natural remedies for thyroid condition.    Has not fallen ill in the past year and a half, attributing this to improved diet and self-care. Prefers influenza vaccine after travel in October 2025 due to tendency to fall ill post-vaccine.    Social History:  Diet: Reduced soda intake, eliminated Dr. Nunzio  Tobacco: Occasional smoking; plans to quit  Sleep: Poor sleep quality due to vivid dreams from Chantix    FAMILY HISTORY  - Family history of Hashimoto's thyroiditis    BP Readings from Last 3 Encounters:   10/09/23 120/76   04/03/23 120/82   03/03/23 126/82      Vitals:    10/09/23 1048   BP: 120/76   BP Site: Left Upper Arm   Patient Position: Sitting   Pulse: 74   SpO2: 97%   Weight: 86.6 kg (191 lb)     Physical Exam  General Appearance: Appears well, no acute distress.  Cardiovascular: Blood pressure well controlled.  Respiratory: Normal effort of breathing, Normal breath Sounds.  Skin: Warm and dry, no rash.  Neurological: Alert, no other focal neurologic findings.  Psychiatric: Normal mood and affect, cooperative.    Lab Results   Component Value Date    HGB 16.4 03/21/2023    CHOL 104 03/21/2023    LDL 47.0 03/21/2023    HDL 26 (L) 03/21/2023    TRIG 155 (H) 03/21/2023    LABGLOM 83  03/21/2023    TSH 2.640 03/21/2023    PSA 1.930 03/21/2023     The ASCVD Risk score (Arnett DK, et al., 2019) failed to calculate for the following reasons:    The valid total cholesterol range is 130 to 320 mg/dL   Results      Assessment & Plan  Internal hemorrhoids:  - Reports minor bleeding and discomfort  - Referred to Dr. Fleta, colorectal surgeon, for further evaluation    Hypertension:  - Blood pressure readings normal today  - Considering reduction in valsartan  dosage due to weight loss  - Advised to monitor blood pressure at home and record readings  - Follow-up call in 10 days to assess levels  - Reduce valsartan  to 80 mg    Hyperlipidemia:  - On rosuvastatin  for cholesterol management  - If abstains from smoking for 6 months, discontinuation of rosuvastatin  will be considered, followed by re-evaluation  - Maintain current dosage of rosuvastatin     Smoking cessation:  - Encouraged to quit smoking cold [place] initially  - Advised to set achievable goals, such as 3-week abstinence  - If unsuccessful, consider alternative medications like nicotine  replacement patches    Hashimoto's thyroiditis:  - Discussed natural remedies but concluded thyroid hormone replacement is most effective  - Advised to ensure adequate iodine intake  Health maintenance:  - Discussed non-pharmacologic measures to prevent illness, such as hand washing and cough covering  - Recommended annual influenza vaccine in October 2025 and COVID-19 vaccine at earliest convenience    Follow-up:  -CPE with Labs in 6 months  Return in about 6 months (around 04/07/2024) for CPE.  Hemorrhoids, unspecified hemorrhoid type  -     [facility] - Chedister, Jacquelyne SAUNDERS, MD, Colecrectal Surgery, [place]  Benign essential hypertension  -     valsartan  (DIOVAN ) 160 MG tablet; Take 0.5 tablets by mouth daily, Disp-90 tablet, R-4NO PRINT  -     Comprehensive Metabolic Panel; Future  -     CBC; Future  Pure hypercholesterolemia  -     Lipid Panel;  Future  Acquired hypothyroidism  -     TSH with Reflex; Future  Encounter for immunization  -     COVID-19 mRNA Vac-TriS,Pfizer, 30 MCG/0.3ML SUSP injection; Inject 0.3 mLs into the muscle once for 1 dose, Disp-0.3 mL, R-0Please dispense the COVID-19 brand vaccine currently in stock if the prescribed brand is not available.Print  -     influenza quadrivalent split vaccine (FLUARIX QUADRIVALENT) 0.5 ML injection; Inject 0.5 mLs into the muscle once for 1 dose, Disp-0.5 mL, R-0Print  Smoker  -     COVID-19 mRNA Vac-TriS,Pfizer, 30 MCG/0.3ML SUSP injection; Inject 0.3 mLs into the muscle once for 1 dose, Disp-0.3 mL, R-0Please dispense the COVID-19 brand vaccine currently in stock if the prescribed brand is not available.Print  -     influenza quadrivalent split vaccine (FLUARIX QUADRIVALENT) 0.5 ML injection; Inject 0.5 mLs into the muscle once for 1 dose, Disp-0.5 mL, R-0Print      Current therapy, including any changes listed above, have been reviewed with Mr. Herzberg during the office visit performed 10/09/23. The most recent labs were reviewed as listed above and labs to be performed today or in the future are ordered as listed above.     The patient (or guardian, if applicable) and other individuals in attendance with the patient were advised that Artificial Intelligence will be utilized during this visit to record, process the conversation to generate a clinical note, and support improvement of the AI technology. The patient (or guardian, if applicable) and other individuals in attendance at the appointment consented to the use of AI, including the recording.        DOROTHA Donnice Server, M.D.    An electronic signature was used to authenticate this note.

## 2023-10-21 NOTE — Telephone Encounter (Signed)
 Spoke with patient, he was calling to report his bp readings for the past week:    9/14-133/92  9/15-130/93  9/16-131/89  9/17-129/80  9/18-130/85  9/19-128/89  9/20-129/92  9/21-127/90  9/22-124/88

## 2023-10-26 NOTE — Telephone Encounter (Signed)
 His BP is right on the border of being high. I do not recommend changes at this time, but would like him to check his BP twice daily the week prior to his next OV in March. Thanks!

## 2023-10-26 NOTE — Telephone Encounter (Signed)
 Sent pt message via [employer]

## 2023-10-30 ENCOUNTER — Ambulatory Visit
Admit: 2023-10-30 | Discharge: 2023-10-30 | Payer: PRIVATE HEALTH INSURANCE | Attending: Colon & Rectal Surgery | Primary: Family Medicine

## 2023-10-30 NOTE — Progress Notes (Signed)
 "October 30, 2023       Jacob Norleen HERO, MD  [address]  [address]  [place],  [place] [address]       Patient:  Jacob Yu    MR Number:  [id number]    Date of Birth:  [birth year]    Date of Visit:  10/30/2023       Dear Dr. Edsel:    I had the pleasure of seeing your patient, Jacob Yu in the office today at [facility]. I am attaching the following encounter for your review as well as the current assessment and plan.    I would like to thank you for the referral and allowing me to participate in the healthcare of this patient.    If you have questions, please do not hesitate to call me. I look forward to following Jacob along with you.    Sincerely,       Jacob JONELLE Ferris, MD            [facility]     10/30/2023          Jacob Yu (DOB:  1975/08/15) is a 48 y.o. [facility] patient, here for evaluation of the following chief complaint(s):  Rectal Bleeding (Patient presents with c/o perceived internal hemorrhoids causing minor bleeding, discharge and discomfort with some swelling on one side. He does report one event about 9 years ago with increasing swelling to the area for which he had lancing performed in the ER. He denies straining, constipation, diarrhea, abdominal bloating, or change in stool consistency and considers himself  fairly regular with daily bowel movements. )      ASSESSMENT/PLAN     Assessment & Plan   ASSESSMENT/PLAN:  1. Hematochezia  2. Internal hemorrhoids with complication  3. External hemorrhoids with complication      Return if symptoms worsen or fail to improve.     48 year old male with noted enlargement of internal/external hemorrhoids today on exam.  We discussed extensively my physical exam findings and his symptoms.  We discussed the pathophysiology of hemorrhoids and answered all patient's questions.  We discussed need for very aggressive bowel regimen including adequate hydration and fiber supplementation with a product  like Metamucil.  We discussed potential surgical intervention including rectal exam under anesthesia and hemorrhoidectomy to best manage his symptoms.  We discussed the risks and benefits of surgery including bleeding, infection, recurrence, pain, urinary retention, fecal incontinence.  After extensive discussion patient does not want to pursue surgery at this time.  We discussed patient can call to schedule surgery in the future if he should desire.  Answered all patient's questions.  Patient overall satisfied with the encounter and the plan.          Subjective   SUBJECTIVE/OBJECTIVE:    HPI:   SYNOPSIS:    Jacob Norleen HERO, MD   Jacob Norleen HERO, MD      Jacob Yu is a 48 year old male with past medical history inclusive of hypertension, hyperlipidemia, hypothyroidism, OSA, who presents after referral from Dr. Edsel for surgical evaluation for symptomatic hemorrhoids.  Patient presents to clinic today in good spirits.  Patient reports his last colonoscopy was 1.5 years ago with [facility].  Patient reports he had polyps.  Patient reports having occasional discomfort and bleeding in his bottom especially after diarrhea bowel movements.  Patient reports some occasional swelling of his hemorrhoids.  Patient denies any previous intervention for hemorrhoids.  Patient is not  current on a bowel regimen but is eating a very healthy diet.  Patient reports a family history inclusive of a maternal grandfather with colon cancer diagnosed in his late 25s.  Patient is an active smoker.  Patient does not take any blood thinners.            Past Medical History:   Diagnosis Date    Allergic sinusitis     Back     Closed fracture of phalanx of left second toe 08/08/2020    Hypothyroidism      Past Surgical History:   Procedure Laterality Date    COLONOSCOPY W/ POLYPECTOMY  2023    KIDNEY STONE SURGERY      OTHER SURGICAL HISTORY Right     labrum     Prior to Admission medications   Medication Sig Start Date End Date Taking?  Authorizing Provider   valsartan  (DIOVAN ) 160 MG tablet Take 0.5 tablets by mouth daily 10/09/23  Yes Jacob Norleen HERO, MD   levothyroxine  (SYNTHROID ) 112 MCG tablet TAKE 1 TABLET ONCE DAILY IN              THE MORNING ON AN EMPTYSTOMACH. DOSAGE REDUCTION,               CANCEL SYNTHROIDTAB 0.125MCG 10/05/23  Yes Jacob Norleen HERO, MD   rosuvastatin  (CRESTOR ) 10 MG tablet Take 1 tablet by mouth every other day 04/03/23  Yes Jacob Norleen HERO, MD   celecoxib  (CELEBREX ) 200 MG capsule Take 1 capsule by mouth daily 04/03/23  Yes Ferguson, John M, MD   cetirizine (ZYRTEC) 10 MG tablet Take 1 tablet by mouth 01/03/19  Yes [provider]   ipratropium (ATROVENT ) 0.06 % nasal spray 2 sprays by Each Nostril route 4 times daily 03/14/22  Yes Jacob Norleen HERO, MD   fluticasone (FLONASE) 50 MCG/ACT nasal spray   2 sprays, Nasal, BID, # 16 g, 0 Refill(s) 11/19/20  Yes [provider]   levocetirizine (XYZAL) 5 MG tablet Take 1 tablet by mouth In the evening once a day   Yes [provider]   Azelastine-Fluticasone 137-50 MCG/ACT SUSP 1 spray in each nostril Nasally Twice a day for 30 day(s)   Yes [facility], MD     Allergies   Allergen Reactions    Lisinopril  Cough    Cat Dander     Environmental/Seasonal      Other reaction(s): ALLERGIC RHINITIS--SEVERITY VARIES    Other Reaction(s): ALLERGIC RHINITIS--SEVERITY VARIES    No Known Allergies     Other      Grass Mix Pollens Allergen Ext     Social History     Socioeconomic History    Marital status: Single     Spouse name: Not on file    Number of children: Not on file    Years of education: Not on file    Highest education level: Not on file   Occupational History    Not on file   Tobacco Use    Smoking status: Some Days     Current packs/day: 0.25     Average packs/day: 0.3 packs/day for 29.8 years (7.4 ttl pk-yrs)     Types: Cigarettes     Start date: 01/27/1994    Smokeless tobacco: Current    Tobacco comments:     03/09/2020   Vaping  Use    Vaping status: Never Used   Substance and Sexual Activity    Alcohol use: Never  Drug use: Never    Sexual activity: Not on file   Other Topics Concern    Not on file   Social History Narrative    Not on file     Social Drivers of Health     Financial Resource Strain: Not on file   Food Insecurity: Not on file   Transportation Needs: Not on file   Physical Activity: Not on file   Stress: Not on file   Social Connections: Not on file   Intimate Partner Violence: Not on file   Housing Stability: Not on file     Family History   Problem Relation Age of Onset    Hypertension Mother     Thyroid Disease Mother     Colon Cancer Maternal Grandfather      Cancer-related family history includes Colon Cancer in his maternal grandfather.     Review of Systems   Constitutional:  Negative for activity change, appetite change, chills, fever and unexpected weight change.   HENT:  Negative for nosebleeds, rhinorrhea and sore throat.    Eyes:  Negative for pain and visual disturbance.   Respiratory:  Negative for cough, chest tightness and shortness of breath.    Cardiovascular:  Negative for chest pain and palpitations.   Gastrointestinal:  Positive for anal bleeding, diarrhea and rectal pain. Negative for abdominal distention and abdominal pain.   Endocrine: Negative for cold intolerance, heat intolerance and polyuria.   Genitourinary:  Negative for difficulty urinating, dysuria, genital sores and urgency.   Musculoskeletal:  Negative for back pain, gait problem, joint swelling and neck pain.   Skin:  Negative for color change, rash and wound.   Allergic/Immunologic: Negative for immunocompromised state.   Neurological:  Negative for dizziness, seizures, weakness, light-headedness and headaches.   Hematological:  Negative for adenopathy. Does not bruise/bleed easily.   Psychiatric/Behavioral:  Negative for behavioral problems, confusion and hallucinations.             Objective   Vitals:    10/30/23 0949   BP: (!) 145/96    Temp: 98.1 F (36.7 C)   SpO2: 97%   Weight: 87.1 kg (192 lb)   Height: 1.651 Yu (5' 5)       Physical Exam  Constitutional:       Appearance: Normal appearance.   HENT:      Head: Normocephalic and atraumatic.      Nose: Nose normal.   Eyes:      Extraocular Movements: Extraocular movements intact.   Cardiovascular:      Rate and Rhythm: Normal rate.   Pulmonary:      Effort: Pulmonary effort is normal.   Abdominal:      General: There is no distension.      Palpations: Abdomen is soft. There is no mass.      Tenderness: There is no abdominal tenderness. There is no guarding.   Genitourinary:     Comments: Rectal exam with chaperone and patient consent.  External exam with noted enlargement of external hemorrhoids.  There is enlargement of external exam with noted enlargement of external hemorrhoids.  There is enlargement of internal hemorrhoids with aspect of hemorrhoidal prolapse especially at left lateral position.  There are some slight chronic irritation of prolapsing internal hemorrhoid.  No other gross pathologies noted on external exam.  On effacement the anus there is no obvious fissure.  Digital rectal exam with normal tone, palpation of enlarged internal hemorrhoids, no other masses or lesions.  No active bleeding on exam.  Musculoskeletal:         General: Normal range of motion.      Cervical back: Normal range of motion.   Skin:     General: Skin is warm and dry.   Neurological:      General: No focal deficit present.      Mental Status: He is alert and oriented to person, place, and time.   Psychiatric:         Mood and Affect: Mood normal.         Behavior: Behavior normal.         Thought Content: Thought content normal.            On this date 10/30/2023 I have spent 30 minutes reviewing previous notes, test results and face to face with the patient discussing the diagnosis and importance of compliance with the treatment plan as well as documenting on the day of the visit.      An electronic  signature was used to authenticate this note.    --Jacob JONELLE Ferris, MD   "
